# Patient Record
Sex: Female | Born: 1960 | Race: White | Hispanic: No | Marital: Single | Smoking: Never smoker
Health system: Southern US, Community
[De-identification: ages and names within clinical notes are randomized; demographics above are authoritative.]

## PROBLEM LIST (undated history)

## (undated) DIAGNOSIS — I10 Essential (primary) hypertension: Secondary | ICD-10-CM

## (undated) DIAGNOSIS — K219 Gastro-esophageal reflux disease without esophagitis: Secondary | ICD-10-CM

## (undated) DIAGNOSIS — J309 Allergic rhinitis, unspecified: Secondary | ICD-10-CM

## (undated) DIAGNOSIS — F32A Depression, unspecified: Secondary | ICD-10-CM

## (undated) DIAGNOSIS — F419 Anxiety disorder, unspecified: Secondary | ICD-10-CM

## (undated) DIAGNOSIS — Z9889 Other specified postprocedural states: Secondary | ICD-10-CM

## (undated) DIAGNOSIS — F329 Major depressive disorder, single episode, unspecified: Secondary | ICD-10-CM

## (undated) DIAGNOSIS — R112 Nausea with vomiting, unspecified: Secondary | ICD-10-CM

## (undated) DIAGNOSIS — G43909 Migraine, unspecified, not intractable, without status migrainosus: Secondary | ICD-10-CM

## (undated) DIAGNOSIS — M722 Plantar fascial fibromatosis: Secondary | ICD-10-CM

## (undated) HISTORY — PX: MYRINGOTOMY: SUR874

## (undated) HISTORY — DX: Allergic rhinitis, unspecified: J30.9

## (undated) HISTORY — DX: Essential (primary) hypertension: I10

## (undated) HISTORY — PX: UPPER GASTROINTESTINAL ENDOSCOPY: SHX188

## (undated) HISTORY — PX: NASAL SINUS SURGERY: SHX719

## (undated) HISTORY — PX: TONSILLECTOMY: SUR1361

## (undated) HISTORY — DX: Plantar fascial fibromatosis: M72.2

## (undated) HISTORY — DX: Anxiety disorder, unspecified: F41.9

## (undated) HISTORY — PX: COLONOSCOPY: SHX174

## (undated) HISTORY — PX: OTHER SURGICAL HISTORY: SHX169

## (undated) HISTORY — DX: Migraine, unspecified, not intractable, without status migrainosus: G43.909

## (undated) HISTORY — DX: Depression, unspecified: F32.A

## (undated) HISTORY — DX: Major depressive disorder, single episode, unspecified: F32.9

---

## 2000-09-29 ENCOUNTER — Ambulatory Visit (HOSPITAL_COMMUNITY): Admission: RE | Admit: 2000-09-29 | Discharge: 2000-09-29 | Payer: Self-pay | Admitting: Family Medicine

## 2000-09-29 ENCOUNTER — Encounter: Payer: Self-pay | Admitting: Family Medicine

## 2002-03-21 ENCOUNTER — Ambulatory Visit (HOSPITAL_COMMUNITY): Admission: RE | Admit: 2002-03-21 | Discharge: 2002-03-21 | Payer: Self-pay | Admitting: Otolaryngology

## 2002-03-21 ENCOUNTER — Encounter: Payer: Self-pay | Admitting: Otolaryngology

## 2002-04-16 ENCOUNTER — Encounter: Admission: RE | Admit: 2002-04-16 | Discharge: 2002-04-16 | Payer: Self-pay | Admitting: Otolaryngology

## 2002-04-16 ENCOUNTER — Encounter: Payer: Self-pay | Admitting: Otolaryngology

## 2002-05-21 ENCOUNTER — Encounter: Payer: Self-pay | Admitting: Family Medicine

## 2002-05-21 ENCOUNTER — Ambulatory Visit (HOSPITAL_COMMUNITY): Admission: RE | Admit: 2002-05-21 | Discharge: 2002-05-21 | Payer: Self-pay | Admitting: Family Medicine

## 2002-05-22 ENCOUNTER — Ambulatory Visit (HOSPITAL_COMMUNITY): Admission: RE | Admit: 2002-05-22 | Discharge: 2002-05-22 | Payer: Self-pay | Admitting: Family Medicine

## 2002-05-22 ENCOUNTER — Encounter: Payer: Self-pay | Admitting: Family Medicine

## 2004-05-03 IMAGING — CT CT CHEST W/ CM
1 of 2 series · 15 of 32 positions shown, 19 images · IV contrast (omnipaque)
Comparison: none

FINDINGS
CLINICAL DATA: 42-YEAR-OLD WITH NODULE SEEN ON CHEST X-RAY; PT HAS COUGH, SHORTNESS OF BREATH; NO
HX OF LUNG CA OR PRIOR SURGERIES
CT OF THE CHEST WITH CONTRAST
COMPARISON TO MULTIPLE PRIOR CHEST X-RAYS.  HELICAL IMAGES ARE PERFORMED THROUGH THE CHEST DURING
ADMINISTRATION OF 100 CC OF OMNIPAQUE 300.  NO MEDIASTINAL OR HILAR ADENOPATHY IS SEEN.  THERE ARE
SMALL, NOT ENLARGED, BILATERAL AXILLARY LYMPH NODES.  THERE IS PATCHY INFILTRATE INVOLVING THE
RIGHT MIDDLE LOBE EXTENDING TOWARDS THE HILUM WHERE AIR BRONCHOGRAMS ARE NOTED.  SIMILAR PATCHY
DENSITY IS ALSO SEEN WITHIN THE LATERAL PORTION OF THE LEFT LOWER LOBE AND JUST ABOVE THE RIGHT
HEMIDIAPHRAGM IN THE RIGHT LOWER LOBE.  INFECTIOUS PROCESS IS FAVORED.  HOWEVER, FOLLOW-UP CHEST X-
RAY IS RECOMMENDED TO DOCUMENT CLEARING OF THIS PROCESS.
IMAGES OF THE UPPER ABDOMEN ARE UNREMARKABLE.  NO PULMONARY NODULES ARE IDENTIFIED.
IMPRESSION
PATCHY INFILTRATES INVOLVING THE RIGHT MIDDLE LOBE AND BILATERAL LOWER LOBES AS DESCRIBED.  OPACITY
IS MORE CONFLUENT WITHIN THE RIGHT MIDDLE LOBE HOWEVER.  FINDINGS FAVOR INFECTIOUS PROCESS AND
THERE IS NO SIGNIFICANT ADENOPATHY.  FOLLOW-UP CHEST X-RAY IS RECOMMENDED TO DOCUMENT CLEARING
HOWEVER.

[Series 436: — · axial · 0.63mm/px · z∈[+1500,+1800]mm · 15 of 66 slices shown, 19 images]
[im 3/66  mediastinal]
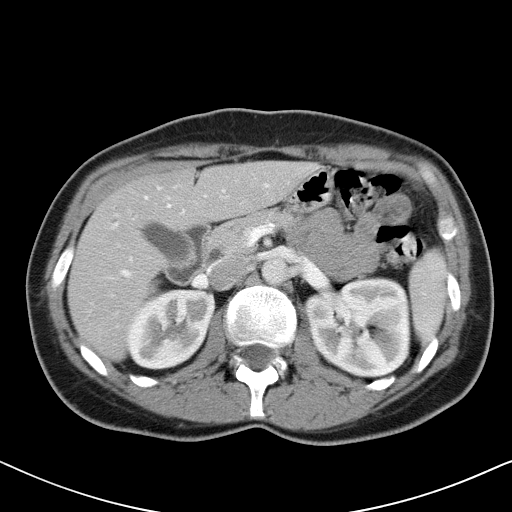
[im 3/66  lung]
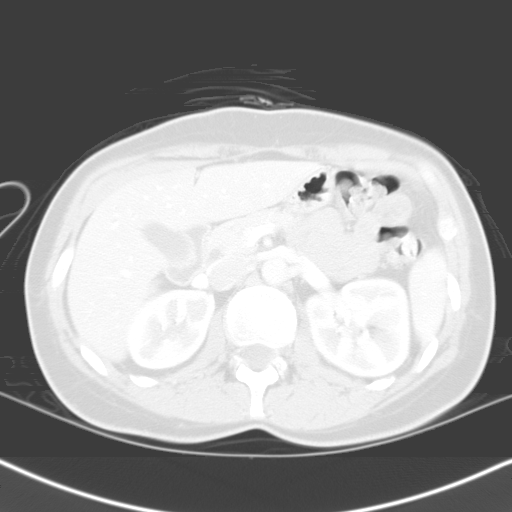
[im 8/66  lung]
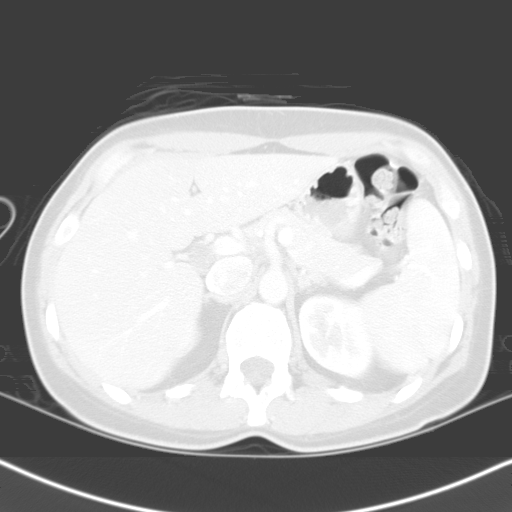
[im 13/66  lung]
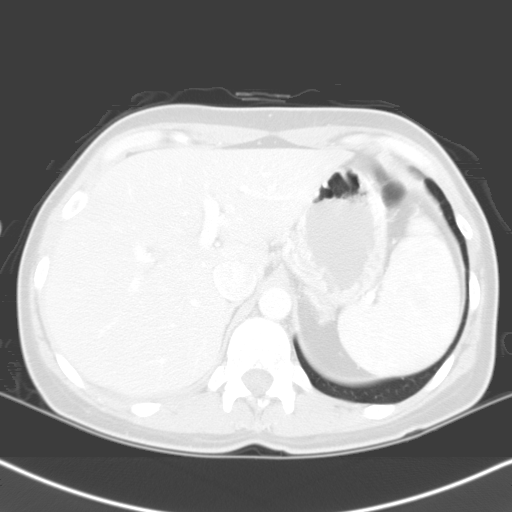
[im 17/66  lung]
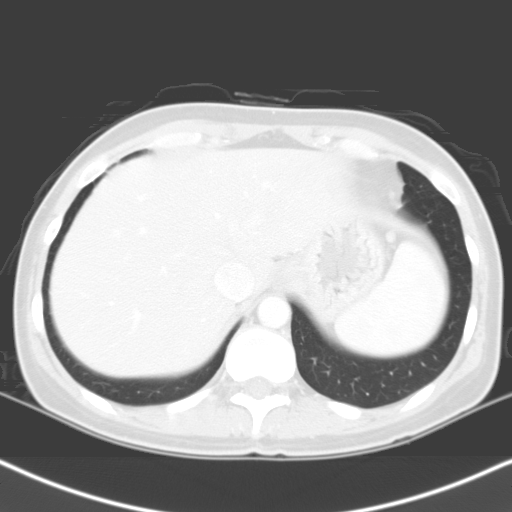
[im 22/66  mediastinal]
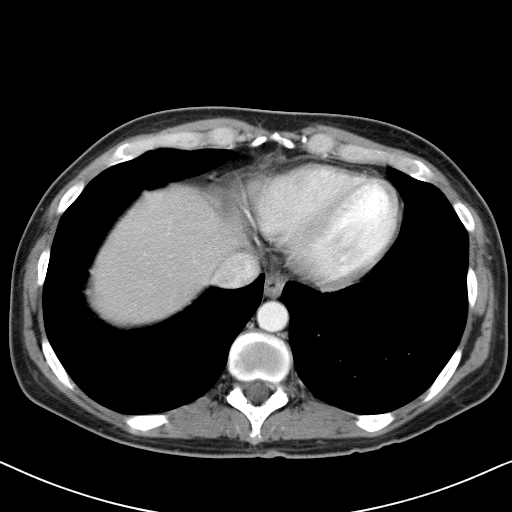
[im 22/66  lung]
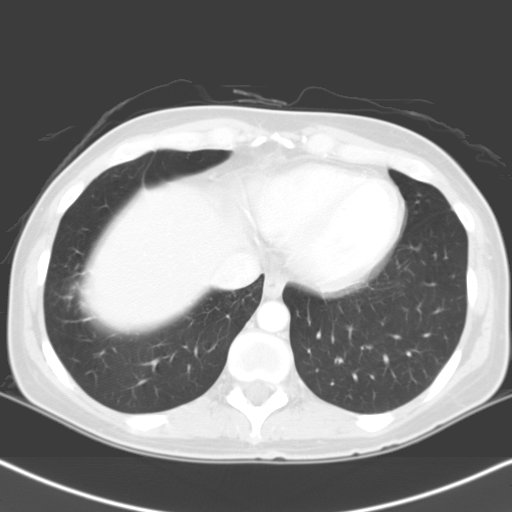
[im 25/66  lung]
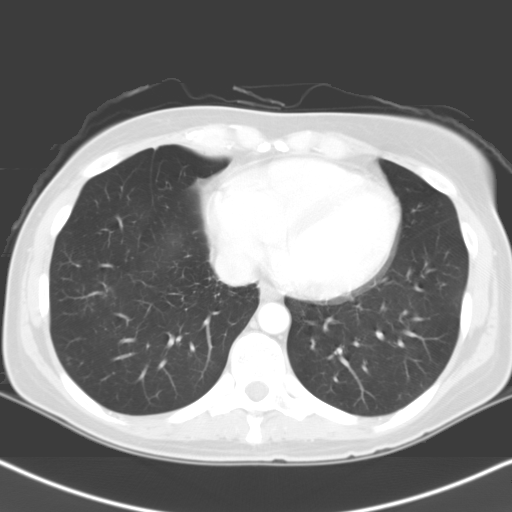
[im 29/66  lung]
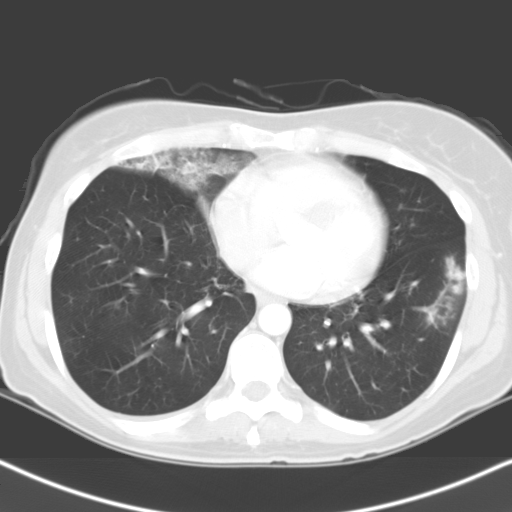
[im 33/66  lung]
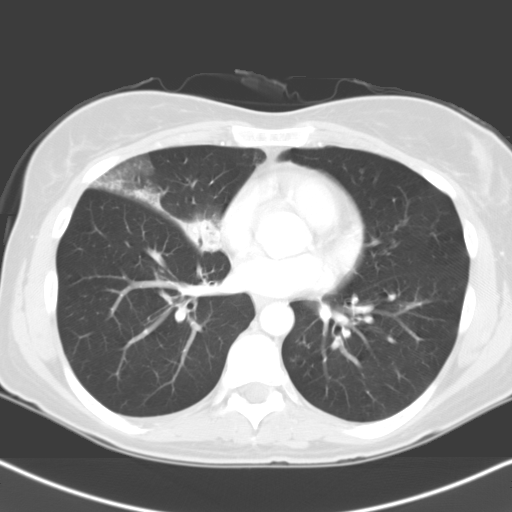
[im 37/66  mediastinal]
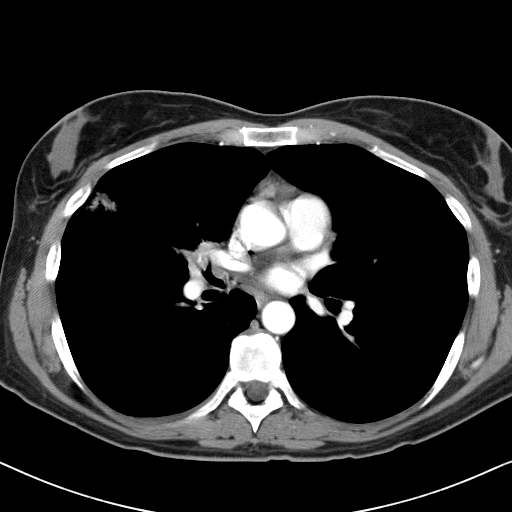
[im 37/66  lung]
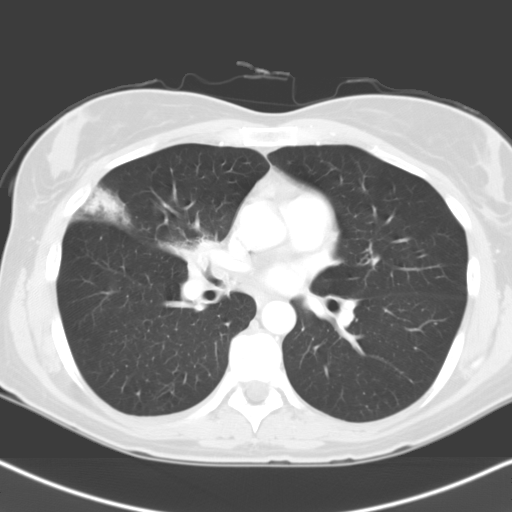
[im 41/66  lung]
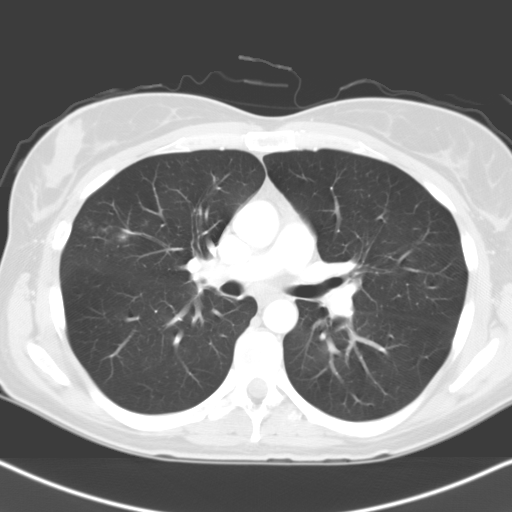
[im 44/66  lung]
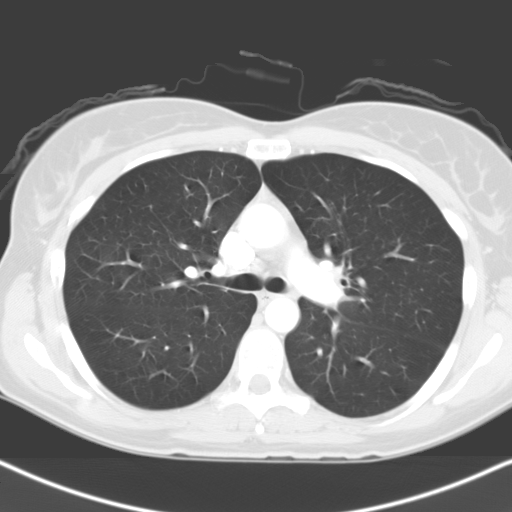
[im 49/66  lung]
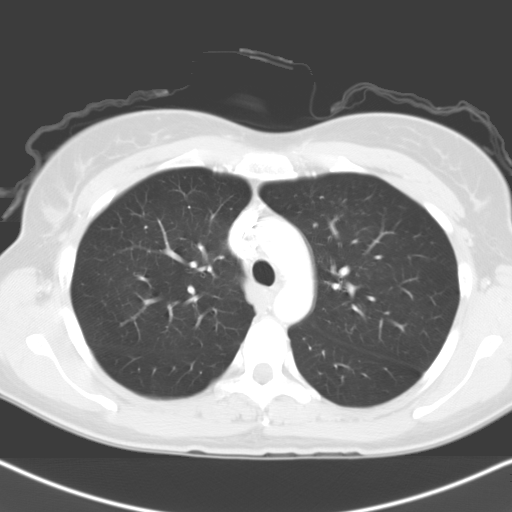
[im 53/66  mediastinal]
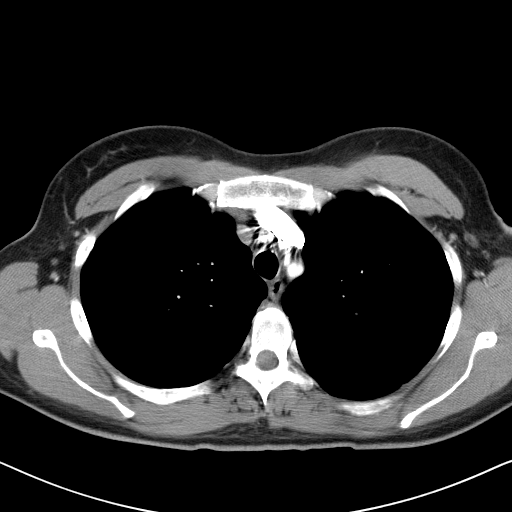
[im 53/66  lung]
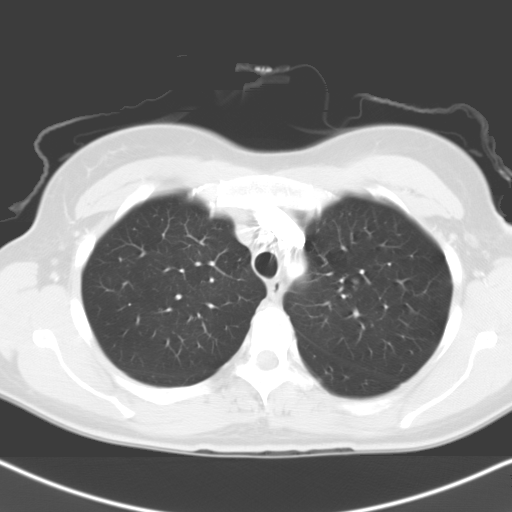
[im 58/66  lung]
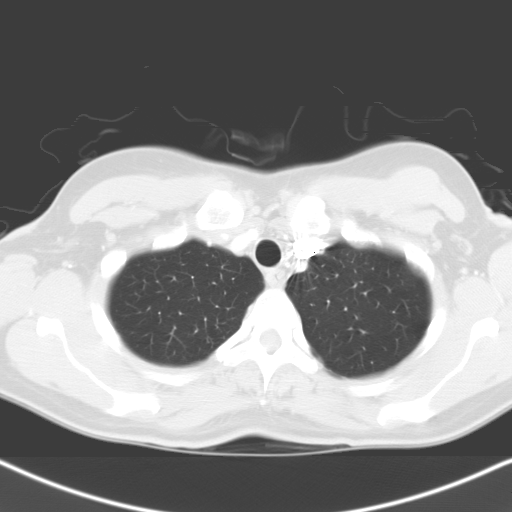
[im 63/66  lung]
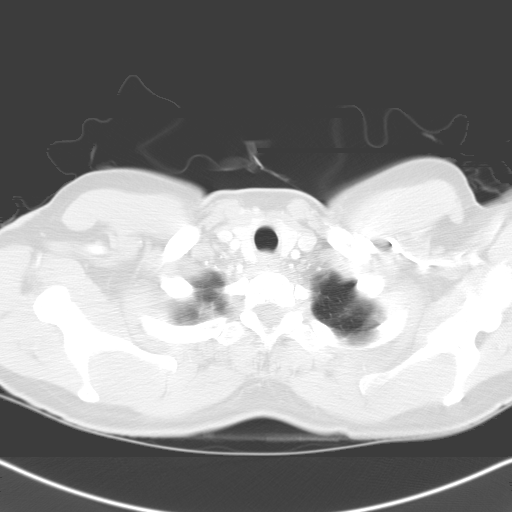

[15 of 32 positions shown; findings below may reference images not displayed]

## 2004-05-11 ENCOUNTER — Ambulatory Visit (HOSPITAL_COMMUNITY): Admission: RE | Admit: 2004-05-11 | Discharge: 2004-05-11 | Payer: Self-pay | Admitting: Family Medicine

## 2008-02-28 ENCOUNTER — Ambulatory Visit (HOSPITAL_COMMUNITY): Admission: RE | Admit: 2008-02-28 | Discharge: 2008-02-28 | Payer: Self-pay | Admitting: Family Medicine

## 2008-09-29 ENCOUNTER — Ambulatory Visit (HOSPITAL_COMMUNITY): Admission: RE | Admit: 2008-09-29 | Discharge: 2008-09-29 | Payer: Self-pay | Admitting: Family Medicine

## 2008-09-30 ENCOUNTER — Ambulatory Visit (HOSPITAL_COMMUNITY): Admission: RE | Admit: 2008-09-30 | Discharge: 2008-09-30 | Payer: Self-pay | Admitting: Family Medicine

## 2010-06-01 ENCOUNTER — Encounter: Payer: Self-pay | Admitting: Urgent Care

## 2010-06-01 ENCOUNTER — Ambulatory Visit (INDEPENDENT_AMBULATORY_CARE_PROVIDER_SITE_OTHER): Payer: 59 | Admitting: Urgent Care

## 2010-06-01 DIAGNOSIS — R198 Other specified symptoms and signs involving the digestive system and abdomen: Secondary | ICD-10-CM

## 2010-06-01 DIAGNOSIS — R194 Change in bowel habit: Secondary | ICD-10-CM

## 2010-06-01 DIAGNOSIS — K219 Gastro-esophageal reflux disease without esophagitis: Secondary | ICD-10-CM | POA: Insufficient documentation

## 2010-06-01 DIAGNOSIS — R1310 Dysphagia, unspecified: Secondary | ICD-10-CM | POA: Insufficient documentation

## 2010-06-01 HISTORY — PX: UPPER GASTROINTESTINAL ENDOSCOPY: SHX188

## 2010-06-01 MED ORDER — PEG 3350-KCL-NA BICARB-NACL 420 G PO SOLR: ORAL | Status: AC

## 2010-06-01 NOTE — Assessment & Plan Note (Addendum)
GERD/Dyspepsia, not controlled on PPI, some epigastric pain. ? Gastritis vs.m PUD.  Symptoms atypical for biliary colic.  Offerred PPI, pt declined until after EGD.

## 2010-06-01 NOTE — Assessment & Plan Note (Addendum)
[name] is a 50 y.o. female here for further evaluation of dysphagia for over a year.  She also has some dyspepsia /GERD symptoms that have failed OTC trial of antacids & prilosec.  She will need further evaluation w/ EGD and possible esophageal dilation to r/o complicated GERD, gastritis, esophageal web, ring, or stricture.  I have discussed risks & benefits which include, but are not limited to, bleeding, infection, perforation & drug reaction.  The patient agrees with this plan & written consent will be obtained.

## 2010-06-01 NOTE — Assessment & Plan Note (Addendum)
Mostly constipation & hard ball-like stools.  Never had a screening colonoscopy--will arrange at the time of EGD. Begin RESTORA daily.  I have discussed risks & benefits which include, but are not limited to, bleeding, infection, perforation & drug reaction.  The patient agrees with this plan & written consent will be obtained.

## 2010-06-01 NOTE — Progress Notes (Addendum)
Primary Care Physician:  Harlow Asa, MD, MD Sherie Don, NP) Primary Gastroenterologist:  Dr. Darrick Penna  Chief Complaint  Patient presents with  . Dysphagia  . Gastrophageal Reflux    HPI:  [name] is a 50 y.o. female here as a new patient for evaluation of dysphagia and change in bowel habits.  Several years of different meds for acid reflux.  "Took a pack" of meds previous, cannot remember the name, unsure if this was h pylori treatment, made sicker.  Took OTC antacids, started probiotics.  C/o epigastric pain & bloating after eating & burning upper abd pain.  Lots of belching.  Rare heartburn & water brash.  Tried prilosec no help.  Feels like food is sticking in upper esophagus.  Always w/ solid foods, no problems w/ liquids.  MVI lodges in throat, liquid would pass.  Appetite ok.  C/o some fatigue, trouble "getting up & going".  Very active lifestyle.    Never had colonoscopy before.  Notes stools have changed to much drier balls than before x 6-7 months.  BM q2 days.  Takes occ stool softener.  Takes OTC laxative if no BM in 2 days.  Tried miralax-no help.  Eats lots of fiber.  Drinks plenty water.  C/o incomplete evacuation.  Past Medical History  Diagnosis Date  . Constipation   . Chronic bronchitis   . Hypertension   . Asthma   . Plantar fasciitis   . Migraines     Past Surgical History  Procedure Date  . Right foot     plantar fascitis  . Nasal sinus surgery     Current Outpatient Prescriptions  Medication Sig Dispense Refill  . SUMAtriptan (IMITREX) 50 MG tablet Take 50 mg by mouth every 2 (two) hours as needed.        . verapamil (CALAN-SR) 240 MG CR tablet Take 240 mg by mouth at bedtime.          Allergies as of 06/01/2010  . (No Known Allergies)    Family History:  There is no known family history of colorectal carcinoma , liver disease, or inflammatory bowel disease.   Problem Relation Age of Onset  . Lung cancer Mother     smoker  . Heart  failure Father     History   Social History  . Marital Status: Single    Spouse Name: N/A    Number of Children: 0  . Years of Education: N/A   Occupational History  . outreach librarian     [employer]   Social History Main Topics  . Smoking status: Never Smoker   . Smokeless tobacco: Not on file  . Alcohol Use: No  . Drug Use: No  . Sexually Active: No   Review of Systems: Gen: Denies any fever, chills, sweats, anorexia, weight loss, and sleep disorder CV: Denies chest pain, angina, palpitations, syncope, orthopnea, PND, peripheral edema, and claudication. Resp: Denies dyspnea at rest, dyspnea with exercise, cough, sputum, wheezing, coughing up blood, and pleurisy. GI: Denies vomiting blood, jaundice, and fecal incontinence. GU : Denies urinary burning, blood in urine, urinary frequency, urinary hesitancy, nocturnal urination, and urinary incontinence. MS: Denies joint pain, limitation of movement, and swelling, stiffness, low back pain, extremity pain. Denies muscle weakness, cramps, atrophy.  Derm: Denies rash, itching, dry skin, hives, moles, warts, or unhealing ulcers.  Psych: Denies depression, anxiety, memory loss, suicidal ideation, hallucinations, paranoia, and confusion. Heme: Denies bruising, bleeding, and enlarged lymph nodes.  Physical  Exam: BP 128/73  Pulse 71  Temp(Src) 98.2 F (36.8 C) (Tympanic)  Ht 5\' 6"  (1.676 m)  Wt 151 lb 6.4 oz (68.675 kg)  BMI 24.44 kg/m2 General:   Alert,  Well-developed, well-nourished, pleasant and cooperative in NAD Head:  Normocephalic and atraumatic. Eyes:  Sclera clear, no icterus.   Conjunctiva pink. Ears:  Normal auditory acuity. Nose:  No deformity, discharge,  or lesions. Mouth:  No deformity or lesions, dentition normal. Neck:  Supple; no masses or thyromegaly. Lungs:  Clear throughout to auscultation.   No wheezes, crackles, or rhonchi. No acute distress. Heart:  Regular rate and rhythm; no murmurs,  clicks, rubs,  or gallops. Abdomen:  Soft, nontender and nondistended. No masses, hepatosplenomegaly or hernias noted. Normal bowel sounds, without guarding, and without rebound.   Rectal:  Deferred until time of colonoscopy.   Msk:  Symmetrical without gross deformities. Normal posture. Pulses:  Normal pulses noted. Extremities:  Without clubbing or edema. Neurologic:  Alert and  oriented x4;  grossly normal neurologically. Skin:  Intact without significant lesions or rashes. Cervical Nodes:  No significant cervical adenopathy. Psych:  Alert and cooperative. Normal mood and affect.

## 2010-06-01 NOTE — Patient Instructions (Signed)
Begin Restora daily   High-Fiber Diet A high-fiber diet changes your normal diet to include more whole grains, legumes, fruits, and vegetables. Changes in the diet involve replacing refined carbohydrates with unrefined foods. The calorie level of the diet is essentially unchanged. The Dietary Reference Intake (recommended amount) for adult males is 38 grams per day. For adult females, it is 25 grams per day. Pregnant and lactating women should consume 28 grams of fiber per day. Fiber is the intact part of a plant that is not broken down during digestion. Functional fiber is fiber that has been isolated from the plant to provide a beneficial effect in the body. PURPOSE Increase stool bulk.  Ease and regulate bowel movements.  Lower cholesterol.  INDICATIONS THAT YOU NEED MORE FIBER Constipation and hemorrhoids.  Uncomplicated diverticulosis (intestine condition) and irritable bowel syndrome.  Weight management.  As a protective measure against hardening of the arteries (atherosclerosis), diabetes, and cancer.  NOTE OF CAUTION If you have a digestive or bowel problem, ask your caregiver for advice before adding high-fiber foods to your diet. Some of the following medical problems are such that a high-fiber diet should not be used without consulting your caregiver. DO NOT USE WITH: Acute diverticulitis (intestine infection).  Partial small bowel obstructions.  Complicated diverticular disease involving bleeding, rupture (perforation), or abscess (boil, furuncle).  Presence of autonomic neuropathy (nerve damage) or gastric paresis (stomach cannot empty itself).  GUIDELINES FOR INCREASING FIBER IN THE DIET Start adding fiber to the diet slowly. A gradual increase of about 5 more grams (2 slices of whole-wheat bread, 2 servings of most fruits or vegetables, or 1 bowl of high-fiber cereal) per day is best. Too rapid an increase in fiber may result in constipation, flatulence, and bloating.  Drink  enough water and fluids to keep your urine clear or pale yellow. Water, juice, or caffeine-free drinks are recommended. Not drinking enough fluid may cause constipation.  Eat a variety of high-fiber foods rather than one type of fiber.  Try to increase your intake of fiber through using high-fiber foods rather than fiber pills or supplements that contain small amounts of fiber.  The goal is to change the types of food eaten. Do not supplement your present diet with high-fiber foods, but replace foods in your present diet.  INCLUDE A VARIETY OF FIBER SOURCES Replace refined and processed grains with whole grains, canned fruits with fresh fruits, and incorporate other fiber sources. White rice, white breads, and most bakery goods contain little or no fiber.  Brown whole-grain rice, buckwheat oats, and many fruits and vegetables are all good sources of fiber. These include: broccoli, Brussels sprouts, cabbage, cauliflower, beets, sweet potatoes, white potatoes (skin on), carrots, tomatoes, eggplant, squash, berries, fresh fruits, and dried fruits.  Cereals appear to be the richest source of fiber. Cereal fiber is found in whole grains and bran. Bran is the fiber-rich outer coat of cereal grain, which is largely removed in refining. In whole-grain cereals, the bran remains. In breakfast cereals, the largest amount of fiber is found in those with "bran" in their names. The fiber content is sometimes indicated on the label.  You may need to include additional fruits and vegetables each day.  In baking, for 1 cup white flour, you may use the following substitutions:  1 cup whole-wheat flour minus 2 tablespoons.  1/2 cup white flour plus 1/2 cup whole-wheat flour.  References: Dietary Reference Intakes: Recommended Intakes for Individuals. [employer]. Institute of Medicine.  Food and Nutrition Board. Document Released: 01/17/2005 Document Re-Released: 04/13/2009 [employer] Patient Information  2011 [employer], [place] Constipation in Adults Constipation is having fewer than 2 bowel movements per week. Usually, the stools are hard. As we grow older, constipation is more common. If you try to fix constipation with laxatives, the problem may get worse. This is because laxatives taken over a long period of time make the colon muscles weaker. A low-fiber diet, not taking in enough fluids, and taking some medicines may make these problems worse. MEDICATIONS THAT MAY CAUSE CONSTIPATION  Water pills (diuretics).  Calcium channel blockers (used to control blood pressure and for the heart).   Certain pain medicines (narcotics).   Anticholinergics.  Anti-inflammatory agents.   Antacids that contain aluminum.   DISEASES THAT CONTRIBUTE TO CONSTIPATION  Diabetes.  Parkinson's disease.   Dementia.   Stroke.  Depression.   Illnesses that cause problems with salt and water metabolism.   HOME CARE INSTRUCTIONS  Constipation is usually best cared for without medicines. Increasing dietary fiber and eating more fruits and vegetables is the best way to manage constipation.   Slowly increase fiber intake to 25 to 38 grams per day. Whole grains, fruits, vegetables, and legumes are good sources of fiber. A dietitian can further help you incorporate high-fiber foods into your diet.   Drink enough water and fluids to keep your urine clear or pale yellow.   A fiber supplement may be added to your diet if you cannot get enough fiber from foods.   Increasing your activities also helps improve regularity.   Suppositories, as suggested by your caregiver, will also help. If you are using antacids, such as aluminum or calcium containing products, it will be helpful to switch to products containing magnesium if your caregiver says it is okay.   If you have been given a liquid injection (enema) today, this is only a temporary measure. It should not be relied on for treatment of longstanding (chronic)  constipation.   Stronger measures, such as magnesium sulfate, should be avoided if possible. This may cause uncontrollable diarrhea. Using magnesium sulfate may not allow you time to make it to the bathroom.  SEEK IMMEDIATE MEDICAL CARE IF:  There is bright red blood in the stool.   The constipation stays for more than 4 days.   There is belly (abdominal) or rectal pain.   You do not seem to be getting better.   You have any questions or concerns.  MAKE SURE YOU:  Understand these instructions.   Will watch your condition.   Will get help right away if you are not doing well or get worse.  Document Released: 10/16/2003 Document Re-Released: 04/13/2009 [facility] Patient Information 2011 [place], [place].

## 2010-06-02 NOTE — Progress Notes (Signed)
Cc to PCP 

## 2010-06-08 ENCOUNTER — Encounter: Payer: 59 | Admitting: Gastroenterology

## 2010-06-08 ENCOUNTER — Ambulatory Visit (HOSPITAL_COMMUNITY)
Admission: RE | Admit: 2010-06-08 | Discharge: 2010-06-08 | Disposition: A | Discharge status: Home / Self Care | Payer: 59 | Source: Ambulatory Visit | Attending: Gastroenterology | Admitting: Gastroenterology

## 2010-06-08 ENCOUNTER — Other Ambulatory Visit: Payer: Self-pay | Admitting: Gastroenterology

## 2010-06-08 DIAGNOSIS — K222 Esophageal obstruction: Secondary | ICD-10-CM | POA: Insufficient documentation

## 2010-06-08 DIAGNOSIS — K2940 Chronic atrophic gastritis without bleeding: Secondary | ICD-10-CM

## 2010-06-08 DIAGNOSIS — Z1211 Encounter for screening for malignant neoplasm of colon: Secondary | ICD-10-CM

## 2010-06-08 DIAGNOSIS — K648 Other hemorrhoids: Secondary | ICD-10-CM | POA: Insufficient documentation

## 2010-06-08 DIAGNOSIS — K633 Ulcer of intestine: Secondary | ICD-10-CM | POA: Insufficient documentation

## 2010-06-08 DIAGNOSIS — R1310 Dysphagia, unspecified: Secondary | ICD-10-CM

## 2010-06-09 ENCOUNTER — Encounter: Payer: Self-pay | Admitting: Gastroenterology

## 2010-06-10 NOTE — Progress Notes (Signed)
Cc path to PCP 

## 2010-06-15 NOTE — Procedures (Signed)
NAMEJANIT, [name]            ACCOUNT NO.:  [id number]   MEDICAL RECORD NO.:  [id number]          PATIENT TYPE:  OUT   LOCATION:  RESP                          FACILITY:  [facility]   PHYSICIAN:  Edward L. Juanetta Gosling, M.D.DATE OF BIRTH:  [birth year]   DATE OF PROCEDURE:  DATE OF DISCHARGE:  09/30/2008                            PULMONARY FUNCTION TEST   1. Spirometry does not show a ventilatory defect, but does show      evidence of significant airflow obstruction.  2. There is no significant bronchodilator improvement.      Edward L. Juanetta Gosling, M.D.  Electronically Signed     ELH/MEDQ  D:  09/30/2008  T:  10/01/2008  Job:  [id number]   cc:   Donna Bernard, M.D.  Fax: [phone]

## 2010-06-17 ENCOUNTER — Ambulatory Visit (HOSPITAL_COMMUNITY)
Admission: RE | Admit: 2010-06-17 | Discharge: 2010-06-17 | Disposition: A | Discharge status: Home / Self Care | Payer: 59 | Source: Ambulatory Visit | Attending: Gastroenterology | Admitting: Gastroenterology

## 2010-06-17 ENCOUNTER — Encounter: Payer: 59 | Admitting: Gastroenterology

## 2010-06-17 DIAGNOSIS — K2990 Gastroduodenitis, unspecified, without bleeding: Secondary | ICD-10-CM

## 2010-06-17 DIAGNOSIS — I10 Essential (primary) hypertension: Secondary | ICD-10-CM | POA: Insufficient documentation

## 2010-06-17 DIAGNOSIS — Z79899 Other long term (current) drug therapy: Secondary | ICD-10-CM | POA: Insufficient documentation

## 2010-06-17 DIAGNOSIS — R1310 Dysphagia, unspecified: Secondary | ICD-10-CM

## 2010-06-17 DIAGNOSIS — K2970 Gastritis, unspecified, without bleeding: Secondary | ICD-10-CM | POA: Insufficient documentation

## 2010-06-17 DIAGNOSIS — K222 Esophageal obstruction: Secondary | ICD-10-CM | POA: Insufficient documentation

## 2010-06-30 NOTE — Op Note (Signed)
  NAMEMARISA, [name]            ACCOUNT NO.:  [id number]  MEDICAL RECORD NO.:  [id number]           PATIENT TYPE:  O  LOCATION:  DAYP                          FACILITY:  [facility]  PHYSICIAN:  Jonette Eva, M.D.     DATE OF BIRTH:  [birth year]  DATE OF PROCEDURE:  06/17/2010 DATE OF DISCHARGE:                              OPERATIVE REPORT   REFERRING PROVIDER:  Donna Bernard, MD  PROCEDURE:  Esophagogastroduodenoscopy with Savary dilation to 16 mm.  INDICATION FOR EXAM:  Ms. [name] is a 50 year old female who had a EGD with dilation in 1993.  She presented with solid dysphagia in 2012. She had a EGD with dilation to 15-mm on May 8.  She presents for subsequent dilation.  She had antral erosions and biopsies showed no evidence of H. pylori gastritis.  FINDINGS: 1. Distal esophageal stricture narrowing in the lumen approximately 13-     14 mm.  The esophagus was dilated from 12.8-16 mm.  The 16-mm     dilator passed with moderate resistance.  Otherwise no evidence of     Barrett's mass, erosion, or ulceration. 2. Multiple areas of linear erythema in the antrum.  Unable to     appreciate erosions. 3. Normal duodenal bulb in second portion of the duodenum.  DIAGNOSES: 1. Mild gastritis. 2. Distal esophageal stricture status post esophageal dilation. 3. Continue omeprazole indefinitely. 4. NEXT Endoscopy with propofol. 5. She has a follow up appointment to see me in 4 months regarding her     dysphagia.  If her symptoms persist, then would consider barium     swallow to evaluate for esophageal motility disorder. 6. No aspirin or NSAIDs for 21 days.  No anticoagulation for 5 days. 7. The patient is instructed to take a probiotic daily indefinitely.  MEDICATIONS: 1. Demerol 100 mg IV. 2. Versed 5 mg IV. 3. Phenergan 25 mg IV.  PROCEDURE TECHNIQUE:  Physical exam was performed.  Informed consent was obtained from the patient explaining the benefits, risks,  and alternatives to the procedure.  The patient was connected to monitor and placed in left lateral position.  Continuous oxygen was provided by nasal cannula and IV medicine administered through an indwelling cannula.  After administration of sedation, the patient's esophagus was intubated and scope was advanced under direct visualization to the second portion of the duodenum.  The scope was removed slowly by careful exam of the color, texture, anatomy, and integrity mucosa on the way out.  Prior to withdrawal of the scope, retroflex view of the cardia was performed and Savary guidewire was placed.  The esophagus was dilated from 12.8-16 mm.  The 12.8-mm dilator passed with mild resistance.  The 16-mm dilator passed with moderate resistance. The dilator and guidewire removed.  The patient was recovered in endoscopy and discharged home in satisfactory condition.     Jonette Eva, M.D.     SF/MEDQ  D:  06/17/2010  T:  06/17/2010  Job:  [id number]  cc:   Donna Bernard, M.D. Fax: [phone]  Electronically Signed by Jonette Eva M.D. on 06/30/2010 02:12:45 PM

## 2010-06-30 NOTE — Op Note (Signed)
[name], [name]            ACCOUNT NO.:  [id number]  MEDICAL RECORD NO.:  [id number]           PATIENT TYPE:  O  LOCATION:  DAYP                          FACILITY:  [facility]  PHYSICIAN:  Jonette Eva, M.D.     DATE OF BIRTH:  Jan 03, 1961  DATE OF PROCEDURE:  06/08/2010 DATE OF DISCHARGE:                               PROCEDURE NOTE   REFERRING PHYSICIAN:  Donna Bernard, M.D.  PROCEDURE: 1. Colonoscopy with cold forceps biopsy of the colon ulcers. 2. Esophagogastroduodenoscopy with cold forceps biopsy of the gastric     mucosa and Savary dilation to 15 mm.  INDICATION FOR EXAM:  [name] is a 50 year old female who had EGD with dilation for dysphagia in 1993.  She required 100 mg of Demerol and 6 Versed.  She presents with solid dysphagia, which has gotten worse over the years.  She had no problem with sedation in 1993.  Of note, she states she is difficult to sedate.  She presents for average risk colon cancer screening.  She does use varying amounts of anti-inflammatory drugs for headaches and various musculoskeletal pains.  FINDINGS: 1. Two 6-mm proximal transverse colon ulcers, biopsied via cold     forceps.  Otherwise, no polyps, masses, diverticula, AVMs seen. 2. Small internal hemorrhoids.  Otherwise, normal retroflexed view of     the rectum. 3. Distal esophageal stricture, narrowing the lumen to approximately     12 to 13 mm.  The esophagus was dilated from 12 to 15 mm.  The 15-     mm dilator passed with moderate resistance.  Otherwise, no evidence     of Barrett's mass, erosions, or ulcerations. 4. Multiple antral erosions associated with erythema.  The biopsies     obtained via cold forceps to evaluate for H. pylori gastritis. 5. Patchy erythema in the duodenal bulb.  Normal ampulla and second     portion of the duodenum.  DIAGNOSES: 1. Erosive gastritis. 2. Distal esophageal stricture. 3. Colon ulcers, most likely secondary to NSAID use. 4. Small  internal hemorrhoids. 5.  THE PATIENT HAS A TORTUOUS COLON.  RECOMMENDATIONS: 1. The patient is to use omeprazole one p.o. 30 minutes prior to her     first meal.  She should do this indefinitely.  Her distal     esophageal stricture is most likely secondary to uncontrolled     gastroesophageal reflux disease.  She does complain of epigastric     pain and bloating after eating and burning in her upper abdomen. 2. Screening colonoscopy in 10 years. 3. Will call with results of her biopsies. 4. She should follow high-fiber diet.  She is given handout on high-     fiber diet, hemorrhoids, gastritis, and reflux. 5. She will be rescheduled for an EGD with dilation in 7-14 days. 6. Follow up in 4 months regarding her dysphagia.  If her symptoms     persist after adequate dilation, then would consider barium swallow     to evaluate for esophageal motility disorder. 7. No aspirin or NSAIDs for 30 days.  No anticoagulation for 7 days.  MEDICATIONS: 1. Demerol 125  mg IV. 2. Versed 7 mg IV. 3. Phenergan 12.5 mg IV.  PROCEDURE TECHNIQUE:  Physical exam was performed.  Informed consent was obtained from the patient, explaining the benefits, risks, and alternatives to the procedure.  The patient was connected to the monitor and placed in left lateral position.  Continuous oxygen was provided by nasal cannula and IV medicine administered through an indwelling cannula.  After administration of sedation and rectal exam, the patient's rectum was intubated.  The scope was advanced under direct visualization to the cecum. The scope was removed slowly by carefully examining the color, texture, anatomy, and integrity of the mucosa on the way out.  After the colonoscopy, the patient's esophagus was intubated with a diagnostic gastroscope and the scope was advanced under direct visualization to the second portion of the duodenum.  Scope was removed slowly by careful examine of the color, texture,  anatomy and integrity of the mucosa on the way out.  Prior to withdrawal of the scope, retroflexed view of the cardia was performed and Savary guidewire was placed.  The esophagus was dilated from 12 to 15 mm.  The guidewire and the dilator were removed.  The patient was recovered in endoscopy and discharged home in satisfactory condition.  PATH: BENIGN COLON ULCER & CHRONIC GASTRITIS 2o to NSAIDs. Discussed at endo visit 5/18.   Jonette Eva, M.D.     SF/MEDQ  D:  06/08/2010  T:  06/09/2010  Job:  [id number]  cc:   Donna Bernard, M.D. Fax: [phone]  Electronically Signed by Jonette Eva M.D. on 06/30/2010 02:11:33 PM

## 2010-08-24 ENCOUNTER — Encounter: Payer: Self-pay | Admitting: Gastroenterology

## 2010-10-12 ENCOUNTER — Encounter: Payer: Self-pay | Admitting: Gastroenterology

## 2010-10-12 ENCOUNTER — Ambulatory Visit (INDEPENDENT_AMBULATORY_CARE_PROVIDER_SITE_OTHER): Payer: BC Managed Care – PPO | Admitting: Gastroenterology

## 2010-10-12 DIAGNOSIS — K219 Gastro-esophageal reflux disease without esophagitis: Secondary | ICD-10-CM

## 2010-10-12 DIAGNOSIS — R1310 Dysphagia, unspecified: Secondary | ICD-10-CM

## 2010-10-12 MED ORDER — OMEPRAZOLE 20 MG PO CPDR: 20.0000 mg | DELAYED_RELEASE_CAPSULE | Freq: Every day | ORAL | Status: DC

## 2010-10-12 NOTE — Progress Notes (Signed)
  Subjective:    Patient ID: [name], female    DOB: 1960-09-14, 51 y.o.   MRN: [id number]  PCP: STV LUKING  HPI Pt has no complaints. Wondering about bowel cleansing. Had episode of stomach burning and nauseated all day. Felt great after cleaning colon for TCS. Stomach feels better. Back to hard balls. Feels like she gets a good amount of fiber. When hiking eats protein bars and nuts. NO TROUBLE SWALLOWING EXCEPT WITH VITAMINS.   Past Medical History  Diagnosis Date  . Constipation   . Chronic bronchitis   . Hypertension   . Asthma   . Plantar fasciitis   . Migraines     Past Surgical History  Procedure Date  . Right foot     plantar fascitis  . Nasal sinus surgery   . Upper gastrointestinal endoscopy MAY 2012 DYSPHAGIA    [id number], ESO STR-SAV DIL 15 MM  . Upper gastrointestinal endoscopy MAY 2012    SAV DIL , next one w/ propofol  . Colonoscopy MAY 2012 ARS    NSAID COLON ULCERS, SML IH    No Known Allergies  Current Outpatient Prescriptions  Medication Sig Dispense Refill  . omeprazole (PRILOSEC) 20 MG capsule Take 1 capsule (20 mg total) by mouth daily.  31 capsule  11  . SUMAtriptan (IMITREX) 50 MG tablet Take 50 mg by mouth every 2 (two) hours as needed.        . verapamil (CALAN-SR) 240 MG CR tablet Take 240 mg by mouth at bedtime.            Review of Systems     Objective:   Physical Exam  Vitals reviewed. Constitutional: She is oriented to person, place, and time. She appears well-developed and well-nourished.  HENT:  Head: Atraumatic.  Cardiovascular: Normal rate, regular rhythm and normal heart sounds.   Pulmonary/Chest: Breath sounds normal.  Abdominal: Soft. Bowel sounds are normal. She exhibits no distension. There is no tenderness.  Neurological: She is alert and oriented to person, place, and time.       NO FOCAL DEFICITS          Assessment & Plan:

## 2010-10-14 ENCOUNTER — Ambulatory Visit: Payer: 59 | Admitting: Gastroenterology

## 2010-10-21 ENCOUNTER — Encounter: Payer: Self-pay | Admitting: Gastroenterology

## 2010-10-21 NOTE — Assessment & Plan Note (Signed)
RESOLVED. 

## 2010-10-21 NOTE — Assessment & Plan Note (Signed)
SX CONTROLLED.  CONTINUE OMP. OPV IN 6 MOS.

## 2010-12-14 ENCOUNTER — Other Ambulatory Visit: Payer: Self-pay | Admitting: Gastroenterology

## 2011-04-21 ENCOUNTER — Encounter: Payer: Self-pay | Admitting: Gastroenterology

## 2011-05-27 ENCOUNTER — Encounter: Payer: Self-pay | Admitting: Gastroenterology

## 2011-05-30 ENCOUNTER — Ambulatory Visit (INDEPENDENT_AMBULATORY_CARE_PROVIDER_SITE_OTHER): Payer: BC Managed Care – PPO | Admitting: Gastroenterology

## 2011-05-30 ENCOUNTER — Encounter: Payer: Self-pay | Admitting: Gastroenterology

## 2011-05-30 DIAGNOSIS — K219 Gastro-esophageal reflux disease without esophagitis: Secondary | ICD-10-CM

## 2011-05-30 DIAGNOSIS — R1310 Dysphagia, unspecified: Secondary | ICD-10-CM

## 2011-05-30 DIAGNOSIS — R194 Change in bowel habit: Secondary | ICD-10-CM

## 2011-05-30 DIAGNOSIS — R198 Other specified symptoms and signs involving the digestive system and abdomen: Secondary | ICD-10-CM

## 2011-05-30 NOTE — Assessment & Plan Note (Signed)
RESOLVED. 

## 2011-05-30 NOTE — Progress Notes (Signed)
  Subjective:    Patient ID: [name], female    DOB: 1960/10/04, 51 y.o.   MRN: [id number]  PCP: STVGerda Diss  HPI Stool hard balls/NOT HAVING SATISFACTORY BMs. Occasional bloating and nausea. Lifting weights and does cardio at home. Thinks she's eating better. Yogurt, fresh fruit. Smaller meals. MAY HAVE BURNING IN HER UPPER ABDOMEN-?1X/Q2WKS. QUIT FOR AWHILE. TAKING [facility] DAILY. EATS YoGURT:6X/WK. Some of the nausea comes if she doesn't have much on her stomach. Happens in work out. DRINKS SMOOTHIES  A LOT. WORKS~ 3 DAYS IN THE MORNING AND ALTERNATES WITH CARDIO ON THE BIKE AT HOME AND SOME ABD WORK.   Past Medical History  Diagnosis Date  . Constipation   . Chronic bronchitis   . Hypertension   . Asthma   . Plantar fasciitis   . Migraines     Past Surgical History  Procedure Date  . Right foot     plantar fascitis  . Nasal sinus surgery   . Upper gastrointestinal endoscopy MAY 2012 DYSPHAGIA    [id number], ESO STR-SAV DIL 15 MM  . Upper gastrointestinal endoscopy MAY 2012    SAV DIL , next one w/ propofol  . Colonoscopy MAY 2012 ARS    NSAID COLON ULCERS, SML IH      Review of Systems     Objective:   Physical Exam  Constitutional: She is oriented to person, place, and time. She appears well-developed and well-nourished. No distress.  HENT:  Head: Normocephalic and atraumatic.  Mouth/Throat: Oropharynx is clear and moist. No oropharyngeal exudate.  Eyes: Pupils are equal, round, and reactive to light. No scleral icterus.  Neck: Normal range of motion. Neck supple.  Cardiovascular: Normal rate, regular rhythm and normal heart sounds.   Pulmonary/Chest: Effort normal and breath sounds normal. No respiratory distress.  Abdominal: Soft. Bowel sounds are normal. She exhibits no distension. There is no tenderness.  Musculoskeletal: Normal range of motion. She exhibits no edema.  Lymphadenopathy:    She has no cervical adenopathy.   Neurological: She is alert and oriented to person, place, and time.       NO FOCAL DEFICITS   Psychiatric: She has a normal mood and affect.          Assessment & Plan:

## 2011-05-30 NOTE — Assessment & Plan Note (Signed)
STILL WITH HARD STOOLS/BLOATING/NAUSEA.  EAT 150-200 CALORIES PRIOR TO EXERCISE. REDUCE DAIRY. CONTINUE PROBIOTIC. CONTINUE WATER AND FIBER. CONSIDER AMITIZA. OPV IN 6 MOS.

## 2011-05-30 NOTE — Assessment & Plan Note (Signed)
SX CONTROLLED.  CONTINUE PRILOSEC. OPV IN 6 MOS.

## 2011-05-30 NOTE — Progress Notes (Signed)
Faxed to PCP

## 2011-05-30 NOTE — Patient Instructions (Signed)
Substitute soy milk or almond milk in your smoothies to reduce bloating.  EAT 150-200 CALORIES PRIOR TO WORK OUT TO PREVENT YOUR GLUCOSE FROM DROPPING & NAUSEA.  CONTINUE [name] HE[facility] AND PRILOSEC.  FOLLOW UP IN 6 MOS.

## 2011-06-24 NOTE — Progress Notes (Signed)
Reminder in epic to follow up in 6 months with SF °

## 2011-10-14 ENCOUNTER — Other Ambulatory Visit: Payer: Self-pay | Admitting: Gastroenterology

## 2011-12-12 ENCOUNTER — Encounter: Payer: Self-pay | Admitting: *Deleted

## 2012-02-14 ENCOUNTER — Encounter: Payer: Self-pay | Admitting: Gastroenterology

## 2012-02-15 ENCOUNTER — Ambulatory Visit (INDEPENDENT_AMBULATORY_CARE_PROVIDER_SITE_OTHER): Payer: BC Managed Care – PPO | Admitting: Gastroenterology

## 2012-02-15 ENCOUNTER — Encounter: Payer: Self-pay | Admitting: Gastroenterology

## 2012-02-15 VITALS — BP 150/81 | HR 64 | Temp 98.1°F | Ht 66.0 in | Wt 164.4 lb

## 2012-02-15 DIAGNOSIS — R198 Other specified symptoms and signs involving the digestive system and abdomen: Secondary | ICD-10-CM

## 2012-02-15 DIAGNOSIS — K219 Gastro-esophageal reflux disease without esophagitis: Secondary | ICD-10-CM

## 2012-02-15 DIAGNOSIS — R194 Change in bowel habit: Secondary | ICD-10-CM

## 2012-02-15 DIAGNOSIS — R1310 Dysphagia, unspecified: Secondary | ICD-10-CM

## 2012-02-15 MED ORDER — OMEPRAZOLE 20 MG PO CPDR: DELAYED_RELEASE_CAPSULE | ORAL | Status: DC

## 2012-02-15 NOTE — Assessment & Plan Note (Signed)
CONTINUES WITH INTERMITTENT CONSTIPATION.  ADD MIRALAX DAILY. INCREASE TO TWICE DAILY AFTER ONE WEEK IF STOOLS REMAIN HARD. CONTINUE FIBER/WATER. OPV IN 6 MOS.

## 2012-02-15 NOTE — Progress Notes (Signed)
  Subjective:    Patient ID: [name], female    DOB: [birth year], 52 y.o.   MRN: [id number]  PCP: Gerda Diss  HPI 3 mos ago working out. Started drinking large amounts of water and stool got better. STAYING HYDRATED. BACK TO "MALTED MILK BALLS." Only thing that has changed. Moved to Prilosec and when picked up BP med. NOW BP GOING UP. HAS APPT W/ PCP NEXT WEEK TO DISCUSS. TRYING TO ADD HEALTHY FOODS. SWALLOWING IS FINE. FEELS LIKE HEARTBURN HAS GOTTEN WORSE SINCE THEY CHANGED YOUR RX FROM OMEPRAZOLE TO PRILOSEC. NOT STRESSED. COFFEE: 3 CU/DAY. MAY OCCASIONALLY DRINK DECAF. EATS CHOCOLATE.   Past Medical History  Diagnosis Date  . Constipation   . Chronic bronchitis   . Hypertension   . Asthma   . Plantar fasciitis   . Migraines    Past Surgical History  Procedure Date  . Right foot     plantar fascitis  . Nasal sinus surgery   . Upper gastrointestinal endoscopy MAY 2012 DYSPHAGIA    [id number], ESO STR-SAV DIL 15 MM  . Upper gastrointestinal endoscopy MAY 2012    SAV DIL , next one w/ propofol  . Colonoscopy MAY 2012 ARS    SLF: NSAID COLON ULCERS, SML IH   No Known Allergies  Current Outpatient Prescriptions  Medication Sig Dispense Refill  . PRILOSEC 20 MG capsule TAKE ONE CAPSULE DAILY.    . SUMAtriptan (IMITREX) 50 MG tablet Take 50 mg by mouth every 2 (two) hours as needed.        . verapamil (CALAN-SR) 240 MG CR tablet Take 240 mg by mouth at bedtime.             Review of Systems     Objective:   Physical Exam  Vitals reviewed. Constitutional: She is oriented to person, place, and time. She appears well-nourished. No distress.  HENT:  Head: Normocephalic and atraumatic.  Mouth/Throat: Oropharynx is clear and moist. No oropharyngeal exudate.  Eyes: Pupils are equal, round, and reactive to light.  Neck: Normal range of motion. Neck supple.  Cardiovascular: Normal rate and regular rhythm.   Pulmonary/Chest: Effort normal and breath sounds  normal. No respiratory distress.  Abdominal: Soft. Bowel sounds are normal. She exhibits no distension. There is no tenderness.  Neurological: She is alert and oriented to person, place, and time.       NO FOCAL DEFICITS   Psychiatric: She has a normal mood and affect.          Assessment & Plan:

## 2012-02-15 NOTE — Patient Instructions (Addendum)
USE MIRALAX ONCE A DAY FOR A WEEK THEN IF YOU STILL HAVE HARD STOOLS INCREASE TO TWICE A DAY.  CONTINUE WATER, FIBER, AND EXERCISE.  CONTINUE PRILOSEC, BUT INCREASE IT TO TWICE A DAY. TAKE 30 MINUTES PRIOR TO MEALS.  FOLLOW UP IN 6 MOS.   Lifestyle and home remedies TO HELP CONTROL REFLUX SYMPTOMS  You may eliminate or reduce the frequency of heartburn by making the following lifestyle changes:    Control your weight. Being overweight is a major risk factor for heartburn and GERD. Excess pounds put pressure on your abdomen, pushing up your stomach and causing acid to back up into your esophagus.     Eat smaller meals. 4 TO 6 MEALS A DAY. This reduces pressure on the lower esophageal sphincter, helping to prevent the valve from opening and acid from washing back into your esophagus.     Loosen your belt. Clothes that fit tightly around your waist put pressure on your abdomen and the lower esophageal sphincter.    Eliminate heartburn triggers. Everyone has specific triggers.     Common triggers such as fatty or fried foods, spicy food, tomato sauce, carbonated beverages, alcohol, chocolate, mint, garlic, onion, caffeine and nicotine may make heartburn worse.     Avoid stooping or bending. Tying your shoes is OK. Bending over for longer periods to weed your garden isn't, especially soon after eating.     Don't lie down after a meal. Wait at least three to four hours after eating before going to bed, and don't lie down right after eating.   Alternative medicine   Several home remedies exist for treating GERD, but they provide only temporary relief. They include drinking baking soda (sodium bicarbonate) added to water or drinking other fluids such as baking soda mixed with cream of tartar and water.   Although these liquids create temporary relief by neutralizing, washing away or buffering acids, eventually they aggravate the situation by adding gas and fluid to your stomach, increasing  pressure and causing more acid reflux. Further, adding more sodium to your diet may increase your blood pressure and add stress to your heart, and excessive bicarbonate ingestion can alter the acid-base balance in your body.

## 2012-02-15 NOTE — Progress Notes (Signed)
Faxed to PCP

## 2012-02-15 NOTE — Assessment & Plan Note (Signed)
RESOLVED.  PPI BID. OPV IN 6 MOS

## 2012-02-15 NOTE — Assessment & Plan Note (Signed)
Not ideally controlled.  AVOID CHOCOLATE. REDUCE CAFFEINE INTAKE. INCREASE PRILOSEC TO BID. OPV IN 6 MOS

## 2012-02-22 NOTE — Progress Notes (Signed)
Reminder in epic to follow up in 6 months with SF in E30 °

## 2012-05-07 ENCOUNTER — Telehealth: Payer: Self-pay | Admitting: Nurse Practitioner

## 2012-05-07 ENCOUNTER — Telehealth: Payer: Self-pay | Admitting: Family Medicine

## 2012-05-07 NOTE — Telephone Encounter (Signed)
Patient says the Verapamil that you put her on is not working. Can you call her something else in to try to [facility]?

## 2012-05-07 NOTE — Telephone Encounter (Signed)
Is pt willing to go back on fluid pill? If so, we will add to verapmil If not, need appt with me to discuss options.

## 2012-05-07 NOTE — Telephone Encounter (Signed)
Patient says that the blood pressure medication that you have her on now(Verapamil 360mg ) is not working. Can you please send another Rx of something different to try to [facility]?

## 2012-05-08 NOTE — Telephone Encounter (Signed)
Med called into [facility]. Patient notified.

## 2012-05-08 NOTE — Telephone Encounter (Signed)
Dyazide 37.5/25 number thiry one qam. Three refills

## 2012-05-08 NOTE — Telephone Encounter (Signed)
Patient said she is willing to try fluid pill and if she doesn't like it she will call and schedule office visit.

## 2012-07-31 ENCOUNTER — Encounter: Payer: Self-pay | Admitting: Orthopedic Surgery

## 2012-07-31 ENCOUNTER — Other Ambulatory Visit: Payer: Self-pay | Admitting: Orthopedic Surgery

## 2012-07-31 ENCOUNTER — Ambulatory Visit (HOSPITAL_COMMUNITY)
Admission: RE | Admit: 2012-07-31 | Discharge: 2012-07-31 | Disposition: A | Discharge status: Home / Self Care | Payer: BC Managed Care – PPO | Source: Ambulatory Visit | Attending: Orthopedic Surgery | Admitting: Orthopedic Surgery

## 2012-07-31 ENCOUNTER — Ambulatory Visit (INDEPENDENT_AMBULATORY_CARE_PROVIDER_SITE_OTHER): Payer: BC Managed Care – PPO | Admitting: Orthopedic Surgery

## 2012-07-31 VITALS — BP 118/70 | Ht 67.0 in | Wt 154.0 lb

## 2012-07-31 DIAGNOSIS — M23329 Other meniscus derangements, posterior horn of medial meniscus, unspecified knee: Secondary | ICD-10-CM

## 2012-07-31 DIAGNOSIS — M23322 Other meniscus derangements, posterior horn of medial meniscus, left knee: Secondary | ICD-10-CM

## 2012-07-31 DIAGNOSIS — M25562 Pain in left knee: Secondary | ICD-10-CM

## 2012-07-31 DIAGNOSIS — M25569 Pain in unspecified knee: Secondary | ICD-10-CM | POA: Insufficient documentation

## 2012-07-31 NOTE — Progress Notes (Signed)
Patient ID: RAJAH LAMBA, female   DOB: 05-01-1960, 52 y.o.   MRN: [id number] Chief Complaint  Patient presents with  . Knee Pain    Left knee pain no injury    History: This is a 52 year old female 3 month history of gradual onset of medial knee pain which is sharp dull 8/10 comes and goes but it has been getting better lately she had a couple of catching episodes and she notices that when she externally rotates the hip flexes the knee especially at night to get a sharp pain on the medial aspect of the knee  Her review of systems includes a history of shortness of breath and cough heartburn and constipation joint pain and stiffness the other 11 systems were normal  She has hypertension some stomach issues no allergies she's had surgery on her sinuses or ears and her heel she is noted to be on verapamil 360 mg triamterene hydrochlorothiazide and omeprazole 20 is a family history of arthritis and cancer  She is single she works at [employer] she does not smoke  She had a left knee x-ray outside facility which I reviewed with the report and both are normal  Vital signs BP 118/70  Ht 5\' 7"  (1.702 m)  Wt 154 lb (69.854 kg)  BMI 24.11 kg/m2   General appearance: the patient is well-developed and well-nourished, grooming and hygiene are normal, body habitus ectomorphic  The patient is alert and oriented x 3; mood and affect are normal  Ambulatory status normal without a limp  Right knee/Left knee Inspection left knee medial joint line tenderness severe Range of motion decreased flexion compared to the opposite knee The Lachman test is normal the anterior and posterior drawer tests are normal and the collateral ligaments are stable Motor exam 5/5 Skin normal; no rash or laceration  McMurray's sign positive  The right knee Inspection revealed no tenderness, ROM was normal and motor exam grade 5/5 quad strength. Ligaments were stable   Cardiovascular exam normal pulse and perfusion  without edema tenderness or varicose veins  X-ray negative  Diagnosis torn medial meniscus  Plan at this point we then observe she's getting better I told her what to watch out for if she calls [place] back we will see her immediately and discuss surgical treatment versus MRI first.  Sensory exam is normal

## 2012-07-31 NOTE — Patient Instructions (Addendum)
Meniscus Injury of the Knee, Arthroscopy You may have an internal derangement of the knee. This means something is wrong inside the knee. Your caregiver can make a more accurate diagnosis (learning what is wrong) by performing an arthroscopic procedure. Your knee has two layers of cartilage. Articular cartilage covers the bone ends. It lets your knee bend and move smoothly. Two menisci (thick pads of cartilage that form a rim inside the joint) help absorb shock. They stabilize your knee. Ligaments bind the bones together. They support your knee joint. Muscles move the joint, help support your knee, and take stress off the joint itself.  ABOUT THE PROCEDURE Arthroscopy is a surgical technique. It allows your orthopedic surgeon to diagnose and treat your knee injury with accuracy. The surgeon looks into your knee through a small scope. The scope is like a small (pencil-sized) telescope. Arthroscopy is less invasive than open knee surgery. You can expect a more rapid recovery. Following your caregiver's instructions will help you recover rapidly and completely. Use crutches, rest, elevate, ice, and do knee exercises as instructed. The length of recovery depends on various factors. These factors include type of injury, age, physical condition, medical conditions, and your determination. How Ashanta Amoroso you will be away from your normal activities will depend on what kind of knee problem you have. It will also depend on how much tissue is damaged. Rebuilding your muscles after arthroscopy helps ensure a full recovery. RECOVERY Recovery after a meniscus injury depends on how much meniscus is damaged. It also depends on whether or not you have damaged other knee tissue. With small tears, your recovery may take a couple weeks. Larger tears will take longer. Meniscus injuries can usually be treated during arthroscopy. If your injury is on the inner edge of the meniscus, your surgeon may trim the meniscus back to a smooth rim.  In other cases, your surgeon will try to repair a damaged meniscus with sutures (stitches). This may lengthen your rehabilitation. It may provide better Harvey Matlack-term health by helping your knee retain its shock absorption abilities. Use crutches, limit weight bearing, rest, elevate, apply ice, and exercise your knee as instructed. If a brace is applied, use as directed. The length of recovery depends on various factors including type of injury, age, physical condition, other medical conditions, and your determination. Your caregiver will help with instructions for rehabilitation of your knee. HOME CARE INSTRUCTIONS  Use crutches and knee exercises as instructed.  Applying an ice pack to your operative site may help with discomfort. It may also keep the swelling down.  Only take over-the-counter or prescription medicines for pain, discomfort, inflammation (soreness)or fever as directed by your caregiver. You may use these only if your caregiver has not given medications that would interfere.  You may resume normal diet and activities as directed. SEEK MEDICAL ATTENTION IF:  There is increased bleeding (more than a small spot) from the wound.  You notice redness, swelling, or increasing pain in the wound.  Pus is coming from wound.  An unexplained oral temperature above 102 F (38.9 C) develops, or as your caregiver suggests.  You notice a foul smell coming from the wound or dressing. SEEK IMMEDIATE MEDICAL CARE IF:  You develop a rash.  You have difficulty breathing.  You have any allergic problems. Document Released: 01/15/2000 Document Revised: 04/11/2011 Document Reviewed: 04/02/2007 [facility] Patient Information 2014 [place], [place].

## 2012-08-06 ENCOUNTER — Other Ambulatory Visit: Payer: Self-pay | Admitting: Family Medicine

## 2012-08-06 ENCOUNTER — Other Ambulatory Visit: Payer: Self-pay | Admitting: Nurse Practitioner

## 2012-08-07 ENCOUNTER — Encounter: Payer: Self-pay | Admitting: *Deleted

## 2012-09-19 ENCOUNTER — Encounter: Payer: Self-pay | Admitting: Orthopedic Surgery

## 2012-09-19 ENCOUNTER — Other Ambulatory Visit: Payer: Self-pay | Admitting: *Deleted

## 2012-09-19 ENCOUNTER — Ambulatory Visit (INDEPENDENT_AMBULATORY_CARE_PROVIDER_SITE_OTHER): Payer: BC Managed Care – PPO | Admitting: Orthopedic Surgery

## 2012-09-19 VITALS — BP 142/88 | Ht 67.0 in | Wt 154.0 lb

## 2012-09-19 DIAGNOSIS — M23329 Other meniscus derangements, posterior horn of medial meniscus, unspecified knee: Secondary | ICD-10-CM

## 2012-09-19 DIAGNOSIS — M23322 Other meniscus derangements, posterior horn of medial meniscus, left knee: Secondary | ICD-10-CM

## 2012-09-19 NOTE — Patient Instructions (Addendum)
Surgery SALK medial meniscus prob sept 5th   Arthroscopic Procedure, Knee An arthroscopic procedure can find what is wrong with your knee. PROCEDURE Arthroscopy is a surgical technique that allows your orthopedic surgeon to diagnose and treat your knee injury with accuracy. They will look into your knee through a small instrument. This is almost like a small (pencil sized) telescope. Because arthroscopy affects your knee less than open knee surgery, you can anticipate a more rapid recovery. Taking an active role by following your caregiver's instructions will help with rapid and complete recovery. Use crutches, rest, elevation, ice, and knee exercises as instructed. The length of recovery depends on various factors including type of injury, age, physical condition, medical conditions, and your rehabilitation. Your knee is the joint between the large bones (femur and tibia) in your leg. Cartilage covers these bone ends which are smooth and slippery and allow your knee to bend and move smoothly. Two menisci, thick, semi-lunar shaped pads of cartilage which form a rim inside the joint, help absorb shock and stabilize your knee. Ligaments bind the bones together and support your knee joint. Muscles move the joint, help support your knee, and take stress off the joint itself. Because of this all programs and physical therapy to rehabilitate an injured or repaired knee require rebuilding and strengthening your muscles. AFTER THE PROCEDURE  After the procedure, you will be moved to a recovery area until most of the effects of the medication have worn off. Your caregiver will discuss the test results with you.   Only take over-the-counter or prescription medicines for pain, discomfort, or fever as directed by your caregiver.    You have been scheduled for arthroscocpic knee surgery.  All surgeries carry some risk.  Remember you always have the option of continued nonsurgical treatment. However in this  situation the risks vs. the benefits favor surgery as the best treatment option. The risks of the surgery includes the following but is not limited to bleeding, infection, pulmonary embolus, death from anesthesia, nerve injury vascular injury or need for further surgery, continued pain.  Specific to this procedure the following risks and complications are rare but possible Stiffness, pain, weakness, giving out  I expect  recovery will be in 3-4 weeks some patients take 6 weeks.  You  will need physical therapy after the procedure  Stop any blood thinning medication: such as warfarin, coumadin, naprosyn, ibuprofen, advil, diclofenac, aspirin

## 2012-09-19 NOTE — Progress Notes (Signed)
Patient ID: [name], female   DOB: Feb 01, 1961, 52 y.o.   MRN: [id number]  Chief Complaint  Patient presents with  . Follow-up    Recheck left knee and discuss surgery    BP 142/88  Ht 5\' 7"  (1.702 m)  Wt 154 lb (69.854 kg)  BMI 24.11 kg/m2  I want to go ahead with surgery , my knee is getting worse>  She is having more pain in her hip is starting hurt from limping she is limping more and having more difficulty after working images and more difficulty working out  Surgery discussed Informed consent discussed   Time 15 min

## 2012-09-20 ENCOUNTER — Ambulatory Visit: Payer: BC Managed Care – PPO | Admitting: Orthopedic Surgery

## 2012-09-24 ENCOUNTER — Telehealth: Payer: Self-pay | Admitting: Orthopedic Surgery

## 2012-09-24 NOTE — Telephone Encounter (Signed)
Contacted insurer, [employer], ph# [phone], regarding pre-authorization information for out-patient surgery scheduled 09/04/12, [facility], [place] [address], [phone], no pre-authorization is required, per Ree Edman; her name and today's date, 09/24/12, 4:59p.m for reference.

## 2012-09-26 ENCOUNTER — Encounter (HOSPITAL_COMMUNITY): Payer: Self-pay | Admitting: Pharmacy Technician

## 2012-09-28 ENCOUNTER — Encounter (HOSPITAL_COMMUNITY): Payer: Self-pay

## 2012-09-28 ENCOUNTER — Encounter (HOSPITAL_COMMUNITY)
Admission: RE | Admit: 2012-09-28 | Discharge: 2012-09-28 | Disposition: A | Discharge status: Home / Self Care | Payer: BC Managed Care – PPO | Source: Ambulatory Visit | Attending: Orthopedic Surgery | Admitting: Orthopedic Surgery

## 2012-09-28 ENCOUNTER — Other Ambulatory Visit: Payer: Self-pay

## 2012-09-28 DIAGNOSIS — Z01810 Encounter for preprocedural cardiovascular examination: Secondary | ICD-10-CM | POA: Insufficient documentation

## 2012-09-28 DIAGNOSIS — Z01812 Encounter for preprocedural laboratory examination: Secondary | ICD-10-CM | POA: Insufficient documentation

## 2012-09-28 HISTORY — DX: Gastro-esophageal reflux disease without esophagitis: K21.9

## 2012-09-28 HISTORY — DX: Nausea with vomiting, unspecified: R11.2

## 2012-09-28 HISTORY — DX: Other specified postprocedural states: Z98.890

## 2012-09-28 LAB — BASIC METABOLIC PANEL
CO2: 30 mEq/L (ref 19–32)
Calcium: 10 mg/dL (ref 8.4–10.5)
Creatinine, Ser: 1.01 mg/dL (ref 0.50–1.10)
Glucose, Bld: 118 mg/dL — ABNORMAL HIGH (ref 70–99)

## 2012-09-28 NOTE — Patient Instructions (Addendum)
[name]  09/28/2012   Your procedure is scheduled on:  10/05/2012 Report to [facility] at  820  AM.  Call this number if you have problems the morning of surgery: [phone]   Remember:   Do not eat food or drink liquids after midnight.   Take these medicines the morning of surgery with A SIP OF WATER: omeprazole, verapamil, maxzide   Do not wear jewelry, make-up or nail polish.  Do not wear lotions, powders, or perfumes.   Do not shave 48 hours prior to surgery. Men may shave face and neck.  Do not bring valuables to the hospital.  [facility] is not responsible for any belongings or valuables.  Contacts, dentures or bridgework may not be worn into surgery.  Leave suitcase in the car. After surgery it may be brought to your room.  For patients admitted to the hospital, checkout time is 11:00 AM the day of discharge.   Patients discharged the day of surgery will not be allowed to drive home.  Name and phone number of your driver: family  Special Instructions: Shower using CHG 2 nights before surgery and the night before surgery.  If you shower the day of surgery use CHG.  Use special wash - you have one bottle of CHG for all showers.  You should use approximately 1/3 of the bottle for each shower.   Please read over the following fact sheets that you were given: Pain Booklet, Coughing and Deep Breathing, Surgical Site Infection Prevention, Anesthesia Post-op Instructions and Care and Recovery After Surgery Arthroscopic Procedure, Knee An arthroscopic procedure can find what is wrong with your knee. PROCEDURE Arthroscopy is a surgical technique that allows your orthopedic surgeon to diagnose and treat your knee injury with accuracy. They will look into your knee through a small instrument. This is almost like a small (pencil sized) telescope. Because arthroscopy affects your knee less than open knee surgery, you can anticipate a more rapid recovery. Taking an active role by  following your caregiver's instructions will help with rapid and complete recovery. Use crutches, rest, elevation, ice, and knee exercises as instructed. The length of recovery depends on various factors including type of injury, age, physical condition, medical conditions, and your rehabilitation. Your knee is the joint between the large bones (femur and tibia) in your leg. Cartilage covers these bone ends which are smooth and slippery and allow your knee to bend and move smoothly. Two menisci, thick, semi-lunar shaped pads of cartilage which form a rim inside the joint, help absorb shock and stabilize your knee. Ligaments bind the bones together and support your knee joint. Muscles move the joint, help support your knee, and take stress off the joint itself. Because of this all programs and physical therapy to rehabilitate an injured or repaired knee require rebuilding and strengthening your muscles. AFTER THE PROCEDURE  After the procedure, you will be moved to a recovery area until most of the effects of the medication have worn off. Your caregiver will discuss the test results with you.  Only take over-the-counter or prescription medicines for pain, discomfort, or fever as directed by your caregiver. SEEK MEDICAL CARE IF:   You have increased bleeding from your wounds.  You see redness, swelling, or have increasing pain in your wounds.  You have pus coming from your wound.  You have an oral temperature above 102 F (38.9 C).  You notice a bad smell coming from the wound or dressing.  You have severe pain with any motion of your knee. SEEK IMMEDIATE MEDICAL CARE IF:   You develop a rash.  You have difficulty breathing.  You have any allergic problems. Document Released: 01/15/2000 Document Revised: 04/11/2011 Document Reviewed: 08/08/2007 [facility] Patient Information 2014 [place], [place]. PATIENT INSTRUCTIONS POST-ANESTHESIA  IMMEDIATELY FOLLOWING SURGERY:  Do not drive or  operate machinery for the first twenty four hours after surgery.  Do not make any important decisions for twenty four hours after surgery or while taking narcotic pain medications or sedatives.  If you develop intractable nausea and vomiting or a severe headache please notify your doctor immediately.  FOLLOW-UP:  Please make an appointment with your surgeon as instructed. You do not need to follow up with anesthesia unless specifically instructed to do so.  WOUND CARE INSTRUCTIONS (if applicable):  Keep a dry clean dressing on the anesthesia/puncture wound site if there is drainage.  Once the wound has quit draining you may leave it open to air.  Generally you should leave the bandage intact for twenty four hours unless there is drainage.  If the epidural site drains for more than 36-48 hours please call the anesthesia department.  QUESTIONS?:  Please feel free to call your physician or the hospital operator if you have any questions, and they will be happy to assist you.

## 2012-10-04 NOTE — H&P (Signed)
  Chief Complaint   Patient presents with   .  Knee Pain       Left knee pain no injury     History: This is a 52 year old female 3 month history of gradual onset of medial knee pain which is sharp dull 8/10 comes and goes but it has been getting better lately she had a couple of catching episodes and she notices that when she externally rotates the hip flexes the knee especially at night to get a sharp pain on the medial aspect of the knee  Her review of systems includes a history of shortness of breath and cough heartburn and constipation joint pain and stiffness the other 11 systems were normal  She has hypertension some stomach issues no allergies she's had surgery on her sinuses or ears and her heel she is noted to be on verapamil 360 mg triamterene hydrochlorothiazide and omeprazole 20 is a family history of arthritis and cancer  She is single she works at [employer] she does not smoke  She had a left knee x-ray outside facility which I reviewed with the report and both are normal  Vital signs BP 118/70  Ht 5\' 7"  (1.702 m)  Wt 154 lb (69.854 kg)  BMI 24.11 kg/m2   General appearance: the patient is well-developed and well-nourished, grooming and hygiene are normal, body habitus ectomorphic  The patient is alert and oriented x 3; mood and affect are normal  Ambulatory status normal without a limp  Right knee/Left knee Inspection left knee medial joint line tenderness severe Range of motion decreased flexion compared to the opposite knee The Lachman test is normal the anterior and posterior drawer tests are normal and the collateral ligaments are stable Motor exam 5/5 Skin normal; no rash or laceration   McMurray's sign positive  The right knee Inspection revealed no tenderness, ROM was normal and motor exam grade 5/5 quad strength. Ligaments were stable   Cardiovascular exam normal pulse and perfusion without edema tenderness or varicose veins  X-ray  negative  Diagnosis torn medial meniscus  I want to go ahead with surgery , my knee is getting worse>  She is having more pain in her hip is starting hurt from limping she is limping more and having more difficulty after working images and more difficulty working out  Arthroscopy left knee partial medial meniscectomy

## 2012-10-05 ENCOUNTER — Encounter (HOSPITAL_COMMUNITY)
Admission: RE | Disposition: A | Discharge status: Home / Self Care | Payer: Self-pay | Source: Ambulatory Visit | Attending: Orthopedic Surgery

## 2012-10-05 ENCOUNTER — Ambulatory Visit (HOSPITAL_COMMUNITY): Payer: BC Managed Care – PPO | Admitting: Anesthesiology

## 2012-10-05 ENCOUNTER — Encounter (HOSPITAL_COMMUNITY): Payer: Self-pay | Admitting: *Deleted

## 2012-10-05 ENCOUNTER — Encounter (HOSPITAL_COMMUNITY): Payer: Self-pay | Admitting: Anesthesiology

## 2012-10-05 ENCOUNTER — Ambulatory Visit (HOSPITAL_COMMUNITY)
Admission: RE | Admit: 2012-10-05 | Discharge: 2012-10-05 | Disposition: A | Discharge status: Home / Self Care | Payer: BC Managed Care – PPO | Source: Ambulatory Visit | Attending: Orthopedic Surgery | Admitting: Orthopedic Surgery

## 2012-10-05 DIAGNOSIS — M23322 Other meniscus derangements, posterior horn of medial meniscus, left knee: Secondary | ICD-10-CM

## 2012-10-05 DIAGNOSIS — M23329 Other meniscus derangements, posterior horn of medial meniscus, unspecified knee: Secondary | ICD-10-CM

## 2012-10-05 DIAGNOSIS — I10 Essential (primary) hypertension: Secondary | ICD-10-CM | POA: Insufficient documentation

## 2012-10-05 HISTORY — PX: KNEE ARTHROSCOPY WITH MEDIAL MENISECTOMY: SHX5651

## 2012-10-05 SURGERY — ARTHROSCOPY, KNEE, WITH MEDIAL MENISCECTOMY
Anesthesia: General | Site: Knee | Laterality: Left | Wound class: Clean

## 2012-10-05 MED ORDER — FENTANYL CITRATE 0.05 MG/ML IJ SOLN
INTRAMUSCULAR | Status: AC
  Filled 2012-10-05: qty 2

## 2012-10-05 MED ORDER — MIDAZOLAM HCL 2 MG/2ML IJ SOLN
INTRAMUSCULAR | Status: AC
  Filled 2012-10-05: qty 2

## 2012-10-05 MED ORDER — SODIUM CHLORIDE 0.9 % IR SOLN
Status: DC | PRN
  Administered 2012-10-05: 1000 mL

## 2012-10-05 MED ORDER — DEXAMETHASONE SODIUM PHOSPHATE 4 MG/ML IJ SOLN
4.0000 mg | Freq: Once | INTRAMUSCULAR | Status: AC
  Administered 2012-10-05: 4 mg via INTRAVENOUS

## 2012-10-05 MED ORDER — SODIUM CHLORIDE 0.9 % IR SOLN
Status: DC | PRN
  Administered 2012-10-05: 11:00:00

## 2012-10-05 MED ORDER — FENTANYL CITRATE 0.05 MG/ML IJ SOLN
INTRAMUSCULAR | Status: AC
  Filled 2012-10-05: qty 5

## 2012-10-05 MED ORDER — CEFAZOLIN SODIUM-DEXTROSE 2-3 GM-% IV SOLR
INTRAVENOUS | Status: AC
  Filled 2012-10-05: qty 50

## 2012-10-05 MED ORDER — CEFAZOLIN SODIUM-DEXTROSE 2-3 GM-% IV SOLR
2.0000 g | INTRAVENOUS | Status: AC
  Administered 2012-10-05: 2 g via INTRAVENOUS

## 2012-10-05 MED ORDER — BUPIVACAINE-EPINEPHRINE PF 0.5-1:200000 % IJ SOLN
INTRAMUSCULAR | Status: AC
  Filled 2012-10-05: qty 20

## 2012-10-05 MED ORDER — HYDROCODONE-ACETAMINOPHEN 7.5-325 MG PO TABS: 1.0000 | ORAL_TABLET | ORAL | Status: DC | PRN

## 2012-10-05 MED ORDER — GLYCOPYRROLATE 0.2 MG/ML IJ SOLN
0.2000 mg | Freq: Once | INTRAMUSCULAR | Status: AC
  Administered 2012-10-05: 0.2 mg via INTRAVENOUS

## 2012-10-05 MED ORDER — GLYCOPYRROLATE 0.2 MG/ML IJ SOLN
INTRAMUSCULAR | Status: AC
  Filled 2012-10-05: qty 1

## 2012-10-05 MED ORDER — DEXAMETHASONE SODIUM PHOSPHATE 4 MG/ML IJ SOLN
INTRAMUSCULAR | Status: AC
  Filled 2012-10-05: qty 1

## 2012-10-05 MED ORDER — SCOPOLAMINE 1 MG/3DAYS TD PT72
MEDICATED_PATCH | TRANSDERMAL | Status: AC
  Filled 2012-10-05: qty 1

## 2012-10-05 MED ORDER — ONDANSETRON HCL 4 MG/2ML IJ SOLN: 4.0000 mg | Freq: Once | INTRAMUSCULAR | Status: DC | PRN

## 2012-10-05 MED ORDER — ONDANSETRON HCL 4 MG/2ML IJ SOLN
INTRAMUSCULAR | Status: AC
  Filled 2012-10-05: qty 2

## 2012-10-05 MED ORDER — PROPOFOL 10 MG/ML IV EMUL
INTRAVENOUS | Status: AC
  Filled 2012-10-05: qty 20

## 2012-10-05 MED ORDER — LACTATED RINGERS IV SOLN
INTRAVENOUS | Status: DC
  Administered 2012-10-05: 1000 mL via INTRAVENOUS

## 2012-10-05 MED ORDER — SODIUM CHLORIDE 0.9 % IR SOLN
Status: DC | PRN
  Administered 2012-10-05 (×2)

## 2012-10-05 MED ORDER — FENTANYL CITRATE 0.05 MG/ML IJ SOLN
INTRAMUSCULAR | Status: DC | PRN
  Administered 2012-10-05: 25 ug via INTRAVENOUS
  Administered 2012-10-05: 50 ug via INTRAVENOUS

## 2012-10-05 MED ORDER — LIDOCAINE HCL (CARDIAC) 20 MG/ML IV SOLN
INTRAVENOUS | Status: DC | PRN
  Administered 2012-10-05: 30 mg via INTRAVENOUS

## 2012-10-05 MED ORDER — CHLORHEXIDINE GLUCONATE 4 % EX LIQD: 60.0000 mL | Freq: Once | CUTANEOUS | Status: DC

## 2012-10-05 MED ORDER — PROPOFOL 10 MG/ML IV BOLUS
INTRAVENOUS | Status: DC | PRN
  Administered 2012-10-05: 150 mg via INTRAVENOUS

## 2012-10-05 MED ORDER — ONDANSETRON HCL 4 MG/2ML IJ SOLN: 4.0000 mg | Freq: Once | INTRAMUSCULAR | Status: DC

## 2012-10-05 MED ORDER — BUPIVACAINE-EPINEPHRINE PF 0.5-1:200000 % IJ SOLN
INTRAMUSCULAR | Status: DC | PRN
  Administered 2012-10-05: 60 mL

## 2012-10-05 MED ORDER — FENTANYL CITRATE 0.05 MG/ML IJ SOLN
25.0000 ug | INTRAMUSCULAR | Status: DC | PRN
  Administered 2012-10-05: 50 ug via INTRAVENOUS

## 2012-10-05 MED ORDER — LIDOCAINE HCL (PF) 1 % IJ SOLN
INTRAMUSCULAR | Status: AC
  Filled 2012-10-05: qty 5

## 2012-10-05 MED ORDER — EPINEPHRINE HCL 1 MG/ML IJ SOLN
INTRAMUSCULAR | Status: AC
  Filled 2012-10-05: qty 5

## 2012-10-05 MED ORDER — SCOPOLAMINE 1 MG/3DAYS TD PT72
1.0000 | MEDICATED_PATCH | Freq: Once | TRANSDERMAL | Status: DC
  Administered 2012-10-05: 1.5 mg via TRANSDERMAL

## 2012-10-05 MED ORDER — MIDAZOLAM HCL 2 MG/2ML IJ SOLN
1.0000 mg | INTRAMUSCULAR | Status: DC | PRN
  Administered 2012-10-05 (×2): 2 mg via INTRAVENOUS

## 2012-10-05 SURGICAL SUPPLY — 55 items
ARTHROWAND PARAGON T2 (SURGICAL WAND)
BAG HAMPER (MISCELLANEOUS) ×2 IMPLANT
BANDAGE ELASTIC 6 VELCRO NS (GAUZE/BANDAGES/DRESSINGS) ×2 IMPLANT
BLADE AGGRESSIVE PLUS 4.0 (BLADE) ×2 IMPLANT
BLADE SURG SZ11 CARB STEEL (BLADE) ×2 IMPLANT
CHLORAPREP W/TINT 26ML (MISCELLANEOUS) ×3 IMPLANT
CLOTH BEACON ORANGE TIMEOUT ST (SAFETY) ×2 IMPLANT
COOLER CRYO IC GRAV AND TUBE (ORTHOPEDIC SUPPLIES) ×2 IMPLANT
CUFF CRYO KNEE LG 20X31 COOLER (ORTHOPEDIC SUPPLIES) IMPLANT
CUFF CRYO KNEE18X23 MED (MISCELLANEOUS) ×1 IMPLANT
CUFF TOURNIQUET SINGLE 34IN LL (TOURNIQUET CUFF) ×1 IMPLANT
CUFF TOURNIQUET SINGLE 44IN (TOURNIQUET CUFF) IMPLANT
CUTTER ANGLED DBL BITE 4.5 (BURR) IMPLANT
DECANTER SPIKE VIAL GLASS SM (MISCELLANEOUS) ×4 IMPLANT
GAUZE SPONGE 4X4 16PLY XRAY LF (GAUZE/BANDAGES/DRESSINGS) ×2 IMPLANT
GAUZE XEROFORM 5X9 LF (GAUZE/BANDAGES/DRESSINGS) ×2 IMPLANT
GLOVE BIOGEL PI IND STRL 7.0 (GLOVE) IMPLANT
GLOVE BIOGEL PI IND STRL 7.5 (GLOVE) IMPLANT
GLOVE BIOGEL PI INDICATOR 7.0 (GLOVE) ×1
GLOVE BIOGEL PI INDICATOR 7.5 (GLOVE) ×1
GLOVE ECLIPSE 6.5 STRL STRAW (GLOVE) ×1 IMPLANT
GLOVE OPTIFIT SS 6.5 STRL BRWN (GLOVE) ×1 IMPLANT
GLOVE SKINSENSE NS SZ8.0 LF (GLOVE) ×1
GLOVE SKINSENSE STRL SZ8.0 LF (GLOVE) ×1 IMPLANT
GLOVE SS N UNI LF 8.5 STRL (GLOVE) ×2 IMPLANT
GOWN STRL REIN XL XLG (GOWN DISPOSABLE) ×5 IMPLANT
HLDR LEG FOAM (MISCELLANEOUS) ×1 IMPLANT
IV NS IRRIG 3000ML ARTHROMATIC (IV SOLUTION) ×5 IMPLANT
KIT BLADEGUARD II DBL (SET/KITS/TRAYS/PACK) ×2 IMPLANT
KIT ROOM TURNOVER AP CYSTO (KITS) ×2 IMPLANT
LEG HOLDER FOAM (MISCELLANEOUS) ×1
MANIFOLD NEPTUNE II (INSTRUMENTS) ×2 IMPLANT
MARKER SKIN DUAL TIP RULER LAB (MISCELLANEOUS) ×2 IMPLANT
NDL HYPO 18GX1.5 BLUNT FILL (NEEDLE) ×1 IMPLANT
NDL HYPO 21X1.5 SAFETY (NEEDLE) ×1 IMPLANT
NDL SPNL 18GX3.5 QUINCKE PK (NEEDLE) ×1 IMPLANT
NEEDLE HYPO 18GX1.5 BLUNT FILL (NEEDLE) ×2 IMPLANT
NEEDLE HYPO 21X1.5 SAFETY (NEEDLE) ×2 IMPLANT
NEEDLE SPNL 18GX3.5 QUINCKE PK (NEEDLE) ×2 IMPLANT
NS IRRIG 1000ML POUR BTL (IV SOLUTION) ×2 IMPLANT
PACK ARTHRO LIMB DRAPE STRL (MISCELLANEOUS) ×2 IMPLANT
PAD ABD 5X9 TENDERSORB (GAUZE/BANDAGES/DRESSINGS) ×2 IMPLANT
PAD ARMBOARD 7.5X6 YLW CONV (MISCELLANEOUS) ×2 IMPLANT
PADDING CAST COTTON 6X4 STRL (CAST SUPPLIES) ×2 IMPLANT
SET ARTHROSCOPY INST (INSTRUMENTS) ×2 IMPLANT
SET ARTHROSCOPY PUMP TUBE (IRRIGATION / IRRIGATOR) ×2 IMPLANT
SET BASIN LINEN APH (SET/KITS/TRAYS/PACK) ×2 IMPLANT
SPONGE GAUZE 4X4 12PLY (GAUZE/BANDAGES/DRESSINGS) ×2 IMPLANT
SUT ETHILON 3 0 FSL (SUTURE) ×1 IMPLANT
SYR 30ML LL (SYRINGE) ×2 IMPLANT
SYRINGE 10CC LL (SYRINGE) ×2 IMPLANT
WAND 50 DEG COVAC W/CORD (SURGICAL WAND) ×1 IMPLANT
WAND 90 DEG TURBOVAC W/CORD (SURGICAL WAND) IMPLANT
WAND ARTHRO PARAGON T2 (SURGICAL WAND) IMPLANT
YANKAUER SUCT BULB TIP 10FT TU (MISCELLANEOUS) ×7 IMPLANT

## 2012-10-05 NOTE — Op Note (Signed)
10/05/2012  10:39 AM  PATIENT:  [name]  52 y.o. female  PRE-OPERATIVE DIAGNOSIS:  Meniscal Tear left knee  POST-OPERATIVE DIAGNOSIS:  Meniscal Tear left knee  Operative findings displaced tear posterior horn medial meniscus Remaining joint surfaces anterior cruciate ligament patellofemoral joint normal  PROCEDURE:  Procedure(s): KNEE ARTHROSCOPY WITH MEDIAL MENISECTOMY (Left)  SURGEON:  Surgeon(s) and Role:    * Vickki Hearing, MD - Primary  PHYSICIAN ASSISTANT:   ASSISTANTS: none   ANESTHESIA:   general  EBL:  Total I/O In: 500 [I.V.:500] Out: -   BLOOD ADMINISTERED:none  DRAINS: none   LOCAL MEDICATIONS USED: 60 cc of Marcaine with epinephrine  SPECIMEN:  No Specimen  DISPOSITION OF SPECIMEN:  N/A  COUNTS:  YES  TOURNIQUET:    DICTATION: .Dragon Dictation  PLAN OF CARE: Discharge to home after PACU  PATIENT DISPOSITION:  PACU - hemodynamically stable.   Delay start of Pharmacological VTE agent (>24hrs) due to surgical blood loss or risk of bleeding: not applicable  Details of procedure  Indications for procedure pain mechanical symptoms unresponsive to nonoperative treatment  The patient was identified in the preop holding area and  the left knee was confirmed as a surgical site and marked. The chart was reviewed  The patient was taken to the operating room and  was given appropriate preoperative antibiotic  and general anesthesia was administered. The operative leg (left) was placed in the arthroscopic leg holder, the well leg was placed in a well leg holder  The left leg was then prepped and draped sterile The surgical site was confirmed and the timeout procedure was completed  The lateral portal was injected with Marcaine with epinephrine solution and a stab wound was made. The scope was placed in the lateral portal into the medial compartment. The  Diagnostic portion of the  arthroscopy was completed. A medial portal was established in  the same fashion and a probe was placed into the joint. The diagnostic arthroscopy   was repeated using a probe to palpate intra-articular structures  A duckbill forceps was used to morcellized the meniscal tear, the fragments were removed with a motorized shaver. The meniscus was then balanced with an ArthroCare wand 50 probe.  A probe was then used to confirm a stable rim.  The knee was irrigated meniscal fragments remaining were removed. The portals were closed with 3-0 nylon suture. The knee joint was then injected with 45 cc of Marcaine with epinephrine. A sterile dressing was applied followed by an Ace bandage and a Cryo/Cuff.  The patient was extubated and taken to the recovery room in stable condition.

## 2012-10-05 NOTE — Anesthesia Procedure Notes (Addendum)
Procedure Name: LMA Insertion Date/Time: 10/05/2012 10:02 AM Performed by: Glynn Octave E Pre-anesthesia Checklist: Patient identified, Patient being monitored, Emergency Drugs available, Timeout performed and Suction available Patient Re-evaluated:Patient Re-evaluated prior to inductionOxygen Delivery Method: Circle System Utilized Preoxygenation: Pre-oxygenation with 100% oxygen Intubation Type: IV induction Ventilation: Mask ventilation without difficulty LMA: LMA inserted LMA Size: 4.0 Number of attempts: 1 Placement Confirmation: positive ETCO2 and breath sounds checked- equal and bilateral

## 2012-10-05 NOTE — Anesthesia Preprocedure Evaluation (Signed)
Anesthesia Evaluation  Patient identified by MRN, date of birth, ID band Patient awake    Reviewed: Allergy & Precautions, H&P , NPO status , Patient's Chart, lab work & pertinent test results  History of Anesthesia Complications (+) PONV  Airway Mallampati: II TM Distance: >3 FB     Dental  (+) Teeth Intact   Pulmonary asthma , Current Smoker,  breath sounds clear to auscultation        Cardiovascular hypertension, Pt. on medications Rhythm:Regular Rate:Normal     Neuro/Psych  Headaches, PSYCHIATRIC DISORDERS Anxiety Depression    GI/Hepatic GERD- ( inactive now)  Controlled and Medicated,  Endo/Other    Renal/GU      Musculoskeletal   Abdominal   Peds  Hematology   Anesthesia Other Findings   Reproductive/Obstetrics                           Anesthesia Physical Anesthesia Plan  ASA: II  Anesthesia Plan: General   Post-op Pain Management:    Induction: Intravenous  Airway Management Planned: LMA  Additional Equipment:   Intra-op Plan:   Post-operative Plan: Extubation in OR  Informed Consent: I have reviewed the patients History and Physical, chart, labs and discussed the procedure including the risks, benefits and alternatives for the proposed anesthesia with the patient or authorized representative who has indicated his/her understanding and acceptance.     Plan Discussed with:   Anesthesia Plan Comments:         Anesthesia Quick Evaluation

## 2012-10-05 NOTE — Transfer of Care (Signed)
Immediate Anesthesia Transfer of Care Note  Patient: [name]  Procedure(s) Performed: Procedure(s): KNEE ARTHROSCOPY WITH MEDIAL MENISECTOMY (Left)  Patient Location: PACU  Anesthesia Type:General  Level of Consciousness: awake  Airway & Oxygen Therapy: Patient Spontanous Breathing  Post-op Assessment: Report given to PACU RN  Post vital signs: Reviewed and stable  Complications: No apparent anesthesia complications

## 2012-10-05 NOTE — Interval H&P Note (Signed)
History and Physical Interval Note:  10/05/2012 9:24 AM  [name]  has presented today for surgery, with the diagnosis of Meniscal Tear left knee  The various methods of treatment have been discussed with the patient and family. After consideration of risks, benefits and other options for treatment, the patient has consented to  Procedure(s): KNEE ARTHROSCOPY WITH MEDIAL MENISECTOMY (Left) as a surgical intervention .  The patient's history has been reviewed, patient examined, no change in status, stable for surgery.  I have reviewed the patient's chart and labs.  Questions were answered to the patient's satisfaction.     Fuller Canada

## 2012-10-05 NOTE — Brief Op Note (Signed)
10/05/2012  10:39 AM  PATIENT:  [name]  52 y.o. female  PRE-OPERATIVE DIAGNOSIS:  Meniscal Tear left knee  POST-OPERATIVE DIAGNOSIS:  Meniscal Tear left knee  Operative findings displaced tear posterior horn medial meniscus Remaining joint surfaces anterior cruciate ligament patellofemoral joint normal  PROCEDURE:  Procedure(s): KNEE ARTHROSCOPY WITH MEDIAL MENISECTOMY (Left)  SURGEON:  Surgeon(s) and Role:    * Dace Denn E Devonte Migues, MD - Primary  PHYSICIAN ASSISTANT:   ASSISTANTS: none   ANESTHESIA:   general  EBL:  Total I/O In: 500 [I.V.:500] Out: -   BLOOD ADMINISTERED:none  DRAINS: none   LOCAL MEDICATIONS USED: 60 cc of Marcaine with epinephrine  SPECIMEN:  No Specimen  DISPOSITION OF SPECIMEN:  N/A  COUNTS:  YES  TOURNIQUET:    DICTATION: .Dragon Dictation  PLAN OF CARE: Discharge to home after PACU  PATIENT DISPOSITION:  PACU - hemodynamically stable.   Delay start of Pharmacological VTE agent (>24hrs) due to surgical blood loss or risk of bleeding: not applicable  Details of procedure  Indications for procedure pain mechanical symptoms unresponsive to nonoperative treatment  The patient was identified in the preop holding area and  the left knee was confirmed as a surgical site and marked. The chart was reviewed  The patient was taken to the operating room and  was given appropriate preoperative antibiotic  and general anesthesia was administered. The operative leg (left) was placed in the arthroscopic leg holder, the well leg was placed in a well leg holder  The left leg was then prepped and draped sterile The surgical site was confirmed and the timeout procedure was completed  The lateral portal was injected with Marcaine with epinephrine solution and a stab wound was made. The scope was placed in the lateral portal into the medial compartment. The  Diagnostic portion of the  arthroscopy was completed. A medial portal was established in  the same fashion and a probe was placed into the joint. The diagnostic arthroscopy   was repeated using a probe to palpate intra-articular structures  A duckbill forceps was used to morcellized the meniscal tear, the fragments were removed with a motorized shaver. The meniscus was then balanced with an ArthroCare wand 50 probe.  A probe was then used to confirm a stable rim.  The knee was irrigated meniscal fragments remaining were removed. The portals were closed with 3-0 nylon suture. The knee joint was then injected with 45 cc of Marcaine with epinephrine. A sterile dressing was applied followed by an Ace bandage and a Cryo/Cuff.  The patient was extubated and taken to the recovery room in stable condition. 

## 2012-10-05 NOTE — Anesthesia Postprocedure Evaluation (Addendum)
  Anesthesia Post-op Note  Patient: [name]  Procedure(s) Performed: Procedure(s): KNEE ARTHROSCOPY WITH MEDIAL MENISECTOMY (Left)  Patient Location: PACU  Anesthesia Type:General  Level of Consciousness: awake, alert  and oriented  Airway and Oxygen Therapy: Patient Spontanous Breathing and Patient connected to face mask oxygen  Post-op Pain: none  Post-op Assessment: Post-op Vital signs reviewed, Patient's Cardiovascular Status Stable, Respiratory Function Stable, Patent Airway and No signs of Nausea or vomiting  Post-op Vital Signs: Reviewed and stable  Complications: No apparent anesthesia complications  142/59, 98, 15, sats98

## 2012-10-08 ENCOUNTER — Ambulatory Visit: Payer: BC Managed Care – PPO | Admitting: Orthopedic Surgery

## 2012-10-08 ENCOUNTER — Encounter (HOSPITAL_COMMUNITY): Payer: Self-pay | Admitting: Orthopedic Surgery

## 2012-10-09 ENCOUNTER — Encounter: Payer: Self-pay | Admitting: Orthopedic Surgery

## 2012-10-09 ENCOUNTER — Ambulatory Visit (INDEPENDENT_AMBULATORY_CARE_PROVIDER_SITE_OTHER): Payer: BC Managed Care – PPO | Admitting: Orthopedic Surgery

## 2012-10-09 DIAGNOSIS — Z9889 Other specified postprocedural states: Secondary | ICD-10-CM

## 2012-10-09 DIAGNOSIS — M23322 Other meniscus derangements, posterior horn of medial meniscus, left knee: Secondary | ICD-10-CM

## 2012-10-09 DIAGNOSIS — M23329 Other meniscus derangements, posterior horn of medial meniscus, unspecified knee: Secondary | ICD-10-CM

## 2012-10-09 NOTE — Patient Instructions (Signed)
START PT    

## 2012-10-09 NOTE — Progress Notes (Signed)
Patient ID: [name], female   DOB: 1960/04/26, 52 y.o.   MRN: [id number]  No chief complaint on file.  Encounter Diagnoses  Name Primary?  . Medial meniscus, posterior horn derangement, left Yes  . S/P arthroscopy of left knee     Portal sites are clean sutures were removed patient is doing well. Recommend physical therapy weightbearing as tolerated followup in 3 weeks

## 2012-10-15 ENCOUNTER — Ambulatory Visit (HOSPITAL_COMMUNITY)
Admission: RE | Admit: 2012-10-15 | Discharge: 2012-10-15 | Disposition: A | Discharge status: Home / Self Care | Payer: BC Managed Care – PPO | Source: Ambulatory Visit | Attending: Orthopedic Surgery | Admitting: Orthopedic Surgery

## 2012-10-15 DIAGNOSIS — M25569 Pain in unspecified knee: Secondary | ICD-10-CM | POA: Insufficient documentation

## 2012-10-15 DIAGNOSIS — IMO0001 Reserved for inherently not codable concepts without codable children: Secondary | ICD-10-CM | POA: Insufficient documentation

## 2012-10-15 DIAGNOSIS — R262 Difficulty in walking, not elsewhere classified: Secondary | ICD-10-CM | POA: Insufficient documentation

## 2012-10-15 DIAGNOSIS — M25669 Stiffness of unspecified knee, not elsewhere classified: Secondary | ICD-10-CM | POA: Insufficient documentation

## 2012-10-15 NOTE — Evaluation (Signed)
Physical Therapy Evaluation  Patient Details  Name: FLARA STORTI MRN: [id number] Date of Birth: 1960-12-27  Today's Date: 10/15/2012 Time: 10:15-11:00 -   charge eval              Visit#: 1 (one time a week; check ROM if ext not improving increae to 2) of 6  Re-eval: 11/05/12 Assessment Diagnosis: Lt SARK Surgical Date: 10/12/12 Next MD Visit: 10/30/2012  Authorization: Ezequiel Essex      Past Medical History:  Past Medical History  Diagnosis Date  . Constipation   . Chronic bronchitis   . Hypertension   . Asthma   . Plantar fasciitis   . Migraines   . Allergic rhinitis   . Depression   . Anxiety   . PONV (postoperative nausea and vomiting)   . GERD (gastroesophageal reflux disease)    Past Surgical History:  Past Surgical History  Procedure Laterality Date  . Right foot      plantar fascitis  . Nasal sinus surgery    . Upper gastrointestinal endoscopy  MAY 2012 DYSPHAGIA    WU:[id number], ESO STR-SAV DIL 15 MM  . Upper gastrointestinal endoscopy  MAY 2012    SAV DIL , next one w/ propofol  . Colonoscopy  MAY 2012 ARS    SLF: NSAID COLON ULCERS, SML IH  . Tonsillectomy    . Myringotomy    . Knee arthroscopy with medial menisectomy Left 10/05/2012    Procedure: LEFT KNEE ARTHROSCOPY WITH MEDIAL MENISECTOMY;  Surgeon: Vickki Hearing, MD;  Location: [facility];  Service: Orthopedics;  Laterality: Left;    Subjective Symptoms/Limitations Symptoms: Ms. Konz states that she had surgery on 10/12/2012.  She is doing better but she is still feeling very stiff and has some pain along the lateral aspect of her knee.  She has been referred to PT to maximize her functional ability Limitations: Sitting How long can you sit comfortably?: 15 minutes How long can you stand comfortably?: Pt states she shifts her weight over to her right leg. How long can you walk comfortably?: Pt has walked for five minutes or less.  Pain Assessment Currently in Pain?:  No/denies Pain Score: 2  Pain Location: Knee Pain Orientation: Left Pain Relieving Factors: ice    Prior Functioning  Prior Function Vocation: Part time employment Vocation Requirements: shift 6 hr. standing/sitting at [employer] Leisure: Hobbies-yes (Comment) Comments: bike, hike, work out at gym  Cognition/Observation Cognition Orientation Level: Oriented X4   Assessment LLE AROM (degrees) Left Knee Extension: 15 Left Knee Flexion: 112 LLE Strength Left Hip Flexion: 5/5 Left Hip Extension: 5/5 Left Hip ABduction: 5/5 Left Hip ADduction: 5/5 Left Knee Flexion: 4/5 Left Knee Extension: 3+/5 Left Ankle Dorsiflexion: 4/5  Exercise/Treatments Mobility/Balance  Ambulation/Gait Ambulation/Gait: Yes (pt has significant limp worked on normalizing gt)   Stretches Active Hamstring Stretch: 3 reps;30 seconds   Seated Long Arc Quad: 5 reps Other Seated Knee Exercises: dorsi/plantar flextin x 10 Supine Quad Sets: 10 reps Heel Slides: 10 reps Terminal Knee Extension: 10 reps    Physical Therapy Assessment and Plan PT Assessment and Plan Pt will benefit from skilled therapeutic intervention in order to improve on the following deficits: Abnormal gait;Pain;Decreased balance;Decreased activity tolerance;Decreased strength;Difficulty walking;Decreased range of motion Rehab Potential: Good PT Frequency: Min 1X/week (increase to 2x/ week if motion does not improve.) PT Duration: 4 weeks PT Plan: Due to high co-pay pt will be seen one time a week progressing as needed.  Pt will need new exercise  sheet.  Make sure extension is improving and check gait.  Pt will need lateral, forward step up, Warrior I, II, and wall squats.    Goals Home Exercise Program Pt/caregiver will Perform Home Exercise Program: For increased ROM;For increased strengthening PT Short Term Goals Time to Complete Short Term Goals: 2 weeks PT Short Term Goal 1: Pt to be able to sit for 30 minutes with comfort  to enjoy a meal PT Short Term Goal 2: Pt to be able to walk for 15 minutes to be able to get a few items at the grocery store PT Short Term Goal 3: Pt ROM improved to 8-130 to allow normalized gt. PT Long Term Goals Time to Complete Long Term Goals: 4 weeks PT Long Term Goal 1: Pt to be able to sit for over an hour with comfort to be able to eat out. PT Long Term Goal 2: Pt to be able to walk for 30 minutes without pain for a healthier lifestyle Long Term Goal 3: ROM 3-130 to allow pt to be able to squt to pick itema off the floor  Problem List Patient Active Problem List   Diagnosis Date Noted  . Stiffness of joint, not elsewhere classified, lower leg 10/15/2012  . Difficulty in walking(719.7) 10/15/2012  . S/P arthroscopy of left knee 10/09/2012  . Medial meniscus, posterior horn derangement 07/31/2012  . Dysphagia 06/01/2010  . GERD (gastroesophageal reflux disease) 06/01/2010  . Change in bowel habits 06/01/2010    General Behavior During Therapy: [facility] for tasks assessed/performed PT Plan of Care PT Home Exercise Plan: given  GP    RUSSELL,CINDY 10/15/2012, 2:41 PM  Physician Documentation Your signature is required to indicate approval of the treatment plan as stated above.  Please sign and either send electronically or make a copy of this report for your files and return this physician signed original.   Please mark one 1.__approve of plan  2. ___approve of plan with the following conditions.   ______________________________                                                          _____________________ Physician Signature                                                                                                             Date

## 2012-10-22 ENCOUNTER — Ambulatory Visit (HOSPITAL_COMMUNITY)
Admission: RE | Admit: 2012-10-22 | Discharge: 2012-10-22 | Disposition: A | Discharge status: Home / Self Care | Payer: BC Managed Care – PPO | Source: Ambulatory Visit | Attending: Orthopedic Surgery | Admitting: Orthopedic Surgery

## 2012-10-22 DIAGNOSIS — R262 Difficulty in walking, not elsewhere classified: Secondary | ICD-10-CM

## 2012-10-22 DIAGNOSIS — M25669 Stiffness of unspecified knee, not elsewhere classified: Secondary | ICD-10-CM

## 2012-10-22 NOTE — Evaluation (Signed)
Physical Therapy Evaluation  Patient Details  Name: [name] MRN: [id number] Date of Birth: 06/21/1960  Today's Date: 10/22/2012 Time: 1025-1105 PT Time Calculation (min): 40 min Charge: There ex 1025-1100; ROM 1100-1005             Visit#: 2 of 2  Re-eval: 10/22/12 Assessment Diagnosis: Lt SARK Surgical Date: 10/12/12 Next MD Visit: 10/30/2012  Authorization: [employer]    Past Medical History:  Past Medical History  Diagnosis Date  . Constipation   . Chronic bronchitis   . Hypertension   . Asthma   . Plantar fasciitis   . Migraines   . Allergic rhinitis   . Depression   . Anxiety   . PONV (postoperative nausea and vomiting)   . GERD (gastroesophageal reflux disease)    Past Surgical History:  Past Surgical History  Procedure Laterality Date  . Right foot      plantar fascitis  . Nasal sinus surgery    . Upper gastrointestinal endoscopy  MAY 2012 DYSPHAGIA    NG:EXBMWUXLK/GMWNUUVOZD, ESO STR-SAV DIL 15 MM  . Upper gastrointestinal endoscopy  MAY 2012    SAV DIL , next one w/ propofol  . Colonoscopy  MAY 2012 ARS    SLF: NSAID COLON ULCERS, SML IH  . Tonsillectomy    . Myringotomy    . Knee arthroscopy with medial menisectomy Left 10/05/2012    Procedure: LEFT KNEE ARTHROSCOPY WITH MEDIAL MENISECTOMY;  Surgeon: Vickki Hearing, MD;  Location: [facility];  Service: Orthopedics;  Laterality: Left;    Subjective Symptoms/Limitations Symptoms: [name] states she has been doing her exercises and working out at [employer].  Pt comes requesting that this is her last PT visit secondary to high co-pay.   Limitations: Sitting How long can you sit comfortably?: 60 minutes was 15 How long can you stand comfortably?: 15 minutes was unable How long can you walk comfortably?: 30 minutes was 5 mintues or less Pain Assessment Currently in Pain?: No/denies (only when doing certain exercises.) Pain Location: Knee Pain Orientation: Left  Exam:  Pt ROM is now 7-125 was  15-112.  Strength is all 5/5;  Pt no longer having a significant limp as with initial evaluation. Exercise/Treatments  Ambulation/Gait Ambulation/Gait: Yes (pt no longer has a significant limp ) Standing Terminal Knee Extension: 10 reps Lateral Step Up: 10 reps Forward Step Up: 10 reps Wall Squat: 10 reps Other Standing Knee Exercises: yoga poses; Warrior I,II and tree   Physical Therapy Assessment and Plan PT Assessment and Plan Clinical Impression Statement: Pt has improved in ROM and strength.  Pt given new higher level exercise program.  Pt requests to work on knee at home secondary to her insureance having such a high copay PT Plan: D/C to [facility]     Goals Home Exercise Program Pt/caregiver will Perform Home Exercise Program: For increased ROM;For increased strengthening PT Goal: Perform Home Exercise Program - Progress: Met PT Short Term Goals PT Short Term Goal 1: Pt to be able to sit for 30 minutes with comfort to enjoy a meal PT Short Term Goal 1 - Progress: Met PT Short Term Goal 2: Pt to be able to walk for 15 minutes to be able to get a few items at the grocery store PT Short Term Goal 2 - Progress: Met PT Short Term Goal 3: Pt ROM improved to 8-130 to allow normalized gt. PT Short Term Goal 3 - Progress: Met PT Long Term Goals Time to Complete Long  Term Goals: 4 weeks PT Long Term Goal 1: Pt to be able to sit for over an hour with comfort to be able to eat out. PT Long Term Goal 1 - Progress: Progressing toward goal PT Long Term Goal 2: Pt to be able to walk for 30 minutes without pain for a healthier lifestyle PT Long Term Goal 2 - Progress: Met Long Term Goal 3: ROM 3-130 to allow pt to be able to squt to pick itema off the floor Long Term Goal 3 Progress: Progressing toward goal  Problem List Patient Active Problem List   Diagnosis Date Noted  . Stiffness of joint, not elsewhere classified, lower leg 10/15/2012  . Difficulty in walking(719.7) 10/15/2012  . S/P  arthroscopy of left knee 10/09/2012  . Medial meniscus, posterior horn derangement 07/31/2012  . Dysphagia 06/01/2010  . GERD (gastroesophageal reflux disease) 06/01/2010  . Change in bowel habits 06/01/2010    General Behavior During Therapy: [facility] for tasks assessed/performed PT Plan of Care PT Home Exercise Plan: given new higher level exercise program  GP    RUSSELL,CINDY 10/22/2012, 2:55 PM  Physician Documentation Your signature is required to indicate approval of the treatment plan as stated above.  Please sign and either send electronically or make a copy of this report for your files and return this physician signed original.   Please mark one 1.__approve of plan  2. ___approve of plan with the following conditions.   ______________________________                                                          _____________________ Physician Signature                                                                                                             Date

## 2012-10-29 ENCOUNTER — Ambulatory Visit (HOSPITAL_COMMUNITY): Payer: BC Managed Care – PPO | Admitting: Physical Therapy

## 2012-10-30 ENCOUNTER — Other Ambulatory Visit: Payer: Self-pay | Admitting: Family Medicine

## 2012-10-30 ENCOUNTER — Encounter: Payer: Self-pay | Admitting: Orthopedic Surgery

## 2012-10-30 ENCOUNTER — Ambulatory Visit (INDEPENDENT_AMBULATORY_CARE_PROVIDER_SITE_OTHER): Payer: BC Managed Care – PPO | Admitting: Orthopedic Surgery

## 2012-10-30 VITALS — BP 120/77 | Ht 67.0 in | Wt 154.0 lb

## 2012-10-30 DIAGNOSIS — Z9889 Other specified postprocedural states: Secondary | ICD-10-CM

## 2012-10-30 DIAGNOSIS — M23329 Other meniscus derangements, posterior horn of medial meniscus, unspecified knee: Secondary | ICD-10-CM

## 2012-10-30 DIAGNOSIS — M23322 Other meniscus derangements, posterior horn of medial meniscus, left knee: Secondary | ICD-10-CM

## 2012-10-30 NOTE — Patient Instructions (Signed)
Can return to work 11/01/12 with no squatting, use stool Ice if swollen Continue home exercises

## 2012-10-30 NOTE — Progress Notes (Signed)
Patient ID: [name], female   DOB: 23-Apr-1960, 52 y.o.   MRN: [id number]  Chief Complaint  Patient presents with  . Follow-up    3 week follow up post op SALK DOS 10/05/12    HISTORY: This is postoperative day #25 status post arthroscopy left knee partial medial meniscectomy the patient tried home exercises because of the large co-pay for physical therapy she did reasonably well still complains of swelling and soreness and inability to make quick sudden change of direction  She does have a slight joint effusion no tenderness in the knee pain when she goes to 120 of flexion and she has straight leg raise weakness without extensor lag  Recommend continue home exercise program returned to work no squatting return in one month for followup

## 2012-11-05 ENCOUNTER — Ambulatory Visit (HOSPITAL_COMMUNITY): Payer: BC Managed Care – PPO | Admitting: *Deleted

## 2012-11-29 ENCOUNTER — Ambulatory Visit: Payer: BC Managed Care – PPO | Admitting: Orthopedic Surgery

## 2012-12-03 ENCOUNTER — Other Ambulatory Visit: Payer: Self-pay | Admitting: Family Medicine

## 2012-12-04 ENCOUNTER — Encounter: Payer: Self-pay | Admitting: Orthopedic Surgery

## 2012-12-04 ENCOUNTER — Ambulatory Visit (INDEPENDENT_AMBULATORY_CARE_PROVIDER_SITE_OTHER): Payer: BC Managed Care – PPO | Admitting: Orthopedic Surgery

## 2012-12-04 DIAGNOSIS — Z9889 Other specified postprocedural states: Secondary | ICD-10-CM

## 2012-12-04 NOTE — Patient Instructions (Signed)
aspercreme and ice   Home exercises   No single leg squats x 1 month

## 2012-12-04 NOTE — Progress Notes (Signed)
Patient ID: [name], female   DOB: Jun 25, 1960, 52 y.o.   MRN: [id number]   8 weeks status post knee arthroscopy left knee doing well has some mild bursal pain medially and quadriceps tendon insertion of pain. I advised her to stop doing open chain exercises and continue closed chain exercise otherwise knee looks good no swelling full range of motion negative McMurray sign and again tenderness over the pedis anserine bursa  She can resume all normal activities  Followup as needed

## 2012-12-06 ENCOUNTER — Other Ambulatory Visit: Payer: Self-pay

## 2012-12-18 ENCOUNTER — Other Ambulatory Visit: Payer: Self-pay | Admitting: Nurse Practitioner

## 2013-01-02 ENCOUNTER — Other Ambulatory Visit: Payer: Self-pay | Admitting: Family Medicine

## 2013-01-17 ENCOUNTER — Other Ambulatory Visit: Payer: Self-pay | Admitting: Nurse Practitioner

## 2013-02-18 ENCOUNTER — Other Ambulatory Visit: Payer: Self-pay | Admitting: Nurse Practitioner

## 2013-02-25 ENCOUNTER — Ambulatory Visit (INDEPENDENT_AMBULATORY_CARE_PROVIDER_SITE_OTHER): Payer: BC Managed Care – PPO | Admitting: Nurse Practitioner

## 2013-02-25 ENCOUNTER — Encounter: Payer: Self-pay | Admitting: Nurse Practitioner

## 2013-02-25 VITALS — BP 130/90 | Ht 66.5 in | Wt 163.1 lb

## 2013-02-25 DIAGNOSIS — Z0000 Encounter for general adult medical examination without abnormal findings: Secondary | ICD-10-CM

## 2013-02-25 DIAGNOSIS — I10 Essential (primary) hypertension: Secondary | ICD-10-CM

## 2013-02-25 DIAGNOSIS — F329 Major depressive disorder, single episode, unspecified: Secondary | ICD-10-CM

## 2013-02-25 DIAGNOSIS — F411 Generalized anxiety disorder: Secondary | ICD-10-CM | POA: Insufficient documentation

## 2013-02-25 DIAGNOSIS — F419 Anxiety disorder, unspecified: Secondary | ICD-10-CM

## 2013-02-25 DIAGNOSIS — K219 Gastro-esophageal reflux disease without esophagitis: Secondary | ICD-10-CM

## 2013-02-25 DIAGNOSIS — F341 Dysthymic disorder: Secondary | ICD-10-CM

## 2013-02-25 MED ORDER — OMEPRAZOLE 20 MG PO CPDR: 20.0000 mg | DELAYED_RELEASE_CAPSULE | Freq: Every day | ORAL | Status: DC | PRN

## 2013-02-25 MED ORDER — SUMATRIPTAN SUCCINATE 50 MG PO TABS: ORAL_TABLET | ORAL | Status: DC

## 2013-02-25 MED ORDER — ALPRAZOLAM 1 MG PO TABS: 0.5000 mg | ORAL_TABLET | Freq: Every evening | ORAL | Status: DC | PRN

## 2013-02-25 MED ORDER — ALBUTEROL SULFATE HFA 108 (90 BASE) MCG/ACT IN AERS: 2.0000 | INHALATION_SPRAY | RESPIRATORY_TRACT | Status: DC | PRN

## 2013-02-25 MED ORDER — VERAPAMIL HCL ER 360 MG PO CP24: 360.0000 mg | ORAL_CAPSULE | Freq: Every day | ORAL | Status: DC

## 2013-02-25 MED ORDER — TRIAMTERENE-HCTZ 37.5-25 MG PO TABS: ORAL_TABLET | ORAL | Status: DC

## 2013-02-25 NOTE — Progress Notes (Signed)
Patient declined flu vaccine.

## 2013-02-27 LAB — LIPID PANEL
CHOLESTEROL: 230 mg/dL — AB (ref 0–200)
HDL: 110 mg/dL (ref >39)
LDL Cholesterol: 113 mg/dL — ABNORMAL HIGH (ref 0–99)
TRIGLYCERIDES: 35 mg/dL (ref <150)
Total CHOL/HDL Ratio: 2.1 Ratio
VLDL: 7 mg/dL (ref 0–40)

## 2013-02-27 LAB — TSH: TSH: 0.465 u[IU]/mL (ref 0.350–4.500)

## 2013-02-27 LAB — HEPATIC FUNCTION PANEL
ALBUMIN: 4.2 g/dL (ref 3.5–5.2)
ALT: 16 U/L (ref 0–35)
AST: 21 U/L (ref 0–37)
Alkaline Phosphatase: 76 U/L (ref 39–117)
Bilirubin, Direct: 0.1 mg/dL (ref 0.0–0.3)
Indirect Bilirubin: 0.4 mg/dL (ref 0.2–1.2)
TOTAL PROTEIN: 7.1 g/dL (ref 6.0–8.3)
Total Bilirubin: 0.5 mg/dL (ref 0.2–1.2)

## 2013-02-27 LAB — BASIC METABOLIC PANEL
BUN: 17 mg/dL (ref 6–23)
CHLORIDE: 102 meq/L (ref 96–112)
CO2: 26 meq/L (ref 19–32)
CREATININE: 0.82 mg/dL (ref 0.50–1.10)
Calcium: 10.1 mg/dL (ref 8.4–10.5)
GLUCOSE: 111 mg/dL — AB (ref 70–99)
Potassium: 4.3 mEq/L (ref 3.5–5.3)
Sodium: 139 mEq/L (ref 135–145)

## 2013-02-28 LAB — VITAMIN D 25 HYDROXY (VIT D DEFICIENCY, FRACTURES): VIT D 25 HYDROXY: 45 ng/mL (ref 30–89)

## 2013-03-03 ENCOUNTER — Encounter: Payer: Self-pay | Admitting: Nurse Practitioner

## 2013-03-03 NOTE — Progress Notes (Signed)
Subjective:  Presents for routine follow up. Recent BP outside office 117/80. Gets regular intense exercise. Healthy diet. Rare use of Xanax. Problems off/ on several months with plantar fasciitis. Rare Imitrex. No CP or SOB. Reflux stable. No abd pain. Occas use of albuterol before exercise.  Objective:   BP 130/90  Ht 5' 6.5" (1.689 m)  Wt 163 lb 2 oz (73.993 kg)  BMI 25.94 kg/m2 NAD. Alert, oriented. Lungs clear. Heart RRR. Abd soft, nontender.  Assessment: Essential hypertension, benign - Plan: Basic metabolic panel, Lipid panel  GERD (gastroesophageal reflux disease)  Anxiety and depression  Routine general medical examination at a health care facility - Plan: Hepatic function panel, Lipid panel, TSH, Vit D  25 hydroxy (rtn osteoporosis monitoring)  Plan:  Meds ordered this encounter  Medications  . DISCONTD: albuterol (VENTOLIN HFA) 108 (90 BASE) MCG/ACT inhaler    Sig: Inhale into the lungs every 6 (six) hours as needed for wheezing or shortness of breath.  . verapamil (VERELAN PM) 360 MG 24 hr capsule    Sig: Take 1 capsule (360 mg total) by mouth daily.    Dispense:  30 capsule    Refill:  5    Order Specific Question:  Supervising Provider    Answer:  Merlyn AlbertLUKING, WILLIAM S [[id number]]  . triamterene-hydrochlorothiazide (MAXZIDE-25) 37.5-25 MG per tablet    Sig: TAKE ONE TABLET DAILY.    Dispense:  30 tablet    Refill:  5    Order Specific Question:  Supervising Provider    Answer:  Merlyn AlbertLUKING, WILLIAM S [[id number]]  . SUMAtriptan (IMITREX) 50 MG tablet    Sig: One po at onset of migraine; may repeat in 2 hours if needed; max 2 per 24 hours    Dispense:  10 tablet    Refill:  5    Order Specific Question:  Supervising Provider    Answer:  Merlyn AlbertLUKING, WILLIAM S [[id number]]  . omeprazole (PRILOSEC) 20 MG capsule    Sig: Take 1 capsule (20 mg total) by mouth daily as needed (Acid Reflux).    Dispense:  30 capsule    Refill:  5    Order Specific Question:  Supervising Provider    Answer:   Merlyn AlbertLUKING, WILLIAM S [[id number]]  . ALPRAZolam (XANAX) 1 MG tablet    Sig: Take 0.5 tablets (0.5 mg total) by mouth at bedtime as needed for sleep.    Dispense:  30 tablet    Refill:  5    Order Specific Question:  Supervising Provider    Answer:  Merlyn AlbertLUKING, WILLIAM S [[id number]]  . albuterol (VENTOLIN HFA) 108 (90 BASE) MCG/ACT inhaler    Sig: Inhale 2 puffs into the lungs every 4 (four) hours as needed for wheezing or shortness of breath.    Dispense:  1 Inhaler    Refill:  5    Order Specific Question:  Supervising Provider    Answer:  Merlyn AlbertLUKING, WILLIAM S [[id number]]  Recheck in 6 months. Recommend appt with podiatry; given info. Reminded about preventive health PE.

## 2013-03-06 ENCOUNTER — Encounter: Payer: Self-pay | Admitting: Nurse Practitioner

## 2013-03-06 DIAGNOSIS — R7301 Impaired fasting glucose: Secondary | ICD-10-CM | POA: Insufficient documentation

## 2013-06-25 ENCOUNTER — Encounter (HOSPITAL_COMMUNITY): Payer: Self-pay | Admitting: Pharmacy Technician

## 2013-06-27 ENCOUNTER — Other Ambulatory Visit: Payer: Self-pay | Admitting: Podiatry

## 2013-07-04 NOTE — Addendum Note (Signed)
Addended by: Ferman Hamming on: 07/04/2013 09:09 AM   Modules accepted: Orders

## 2013-07-05 ENCOUNTER — Inpatient Hospital Stay (HOSPITAL_COMMUNITY): Admission: RE | Admit: 2013-07-05 | Payer: BC Managed Care – PPO | Source: Ambulatory Visit

## 2013-07-11 ENCOUNTER — Encounter (HOSPITAL_COMMUNITY): Admission: RE | Payer: Self-pay | Source: Ambulatory Visit

## 2013-07-11 ENCOUNTER — Ambulatory Visit (HOSPITAL_COMMUNITY): Admission: RE | Admit: 2013-07-11 | Payer: BC Managed Care – PPO | Source: Ambulatory Visit | Admitting: Podiatry

## 2013-07-11 SURGERY — FASCIOTOMY, PLANTAR, ENDOSCOPIC
Anesthesia: Monitor Anesthesia Care | Laterality: Left

## 2013-09-02 ENCOUNTER — Other Ambulatory Visit: Payer: Self-pay | Admitting: Nurse Practitioner

## 2013-09-13 ENCOUNTER — Other Ambulatory Visit: Payer: Self-pay | Admitting: Nurse Practitioner

## 2013-10-02 ENCOUNTER — Other Ambulatory Visit: Payer: Self-pay | Admitting: Family Medicine

## 2013-10-12 ENCOUNTER — Other Ambulatory Visit: Payer: Self-pay | Admitting: Family Medicine

## 2013-11-04 ENCOUNTER — Other Ambulatory Visit: Payer: Self-pay | Admitting: Family Medicine

## 2013-12-04 ENCOUNTER — Other Ambulatory Visit: Payer: Self-pay | Admitting: Family Medicine

## 2013-12-16 ENCOUNTER — Encounter: Payer: Self-pay | Admitting: Family Medicine

## 2013-12-16 ENCOUNTER — Ambulatory Visit (INDEPENDENT_AMBULATORY_CARE_PROVIDER_SITE_OTHER): Payer: BC Managed Care – PPO | Admitting: Family Medicine

## 2013-12-16 VITALS — BP 132/80 | Temp 99.0°F | Ht 66.0 in | Wt 159.0 lb

## 2013-12-16 DIAGNOSIS — J4521 Mild intermittent asthma with (acute) exacerbation: Secondary | ICD-10-CM

## 2013-12-16 MED ORDER — AZITHROMYCIN 250 MG PO TABS: ORAL_TABLET | ORAL | Status: DC

## 2013-12-16 MED ORDER — PREDNISONE 20 MG PO TABS: ORAL_TABLET | ORAL | Status: DC

## 2013-12-16 NOTE — Progress Notes (Signed)
   Subjective:    Patient ID: [name], female    DOB: 02/16/1960, 53 y.o.   MRN: [id number]  Cough This is a new problem. The current episode started in the past 7 days. Associated symptoms include a fever, headaches and wheezing. Associated symptoms comments: Chest tightness, runny nose, abd pain. Treatments tried: mucinex, tylenol. The treatment provided no relief. Her past medical history is significant for asthma and bronchitis.   Cough not prod  Felt tight  Some achiness  No hi fev  No vom or diarhea     Review of Systems  Constitutional: Positive for fever.  Respiratory: Positive for cough and wheezing.   Neurological: Positive for headaches.       Objective:   Physical Exam  Alert mild malaise. HEENT moderate nasal congestion pharynx normal lungs bilateral wheezes no tachypneasome rhonchi heart regular in rhythm.      Assessment & Plan:  Impression acute bronchitis with reactive airway plan antibiotics prescribed. Prednisone taper. Albuterol when necessary. 4 times a day. WSL

## 2013-12-17 ENCOUNTER — Other Ambulatory Visit: Payer: Self-pay | Admitting: Family Medicine

## 2013-12-30 ENCOUNTER — Other Ambulatory Visit: Payer: Self-pay | Admitting: Family Medicine

## 2014-01-06 ENCOUNTER — Encounter: Payer: Self-pay | Admitting: Nurse Practitioner

## 2014-01-06 ENCOUNTER — Ambulatory Visit (INDEPENDENT_AMBULATORY_CARE_PROVIDER_SITE_OTHER): Payer: BC Managed Care – PPO | Admitting: Nurse Practitioner

## 2014-01-06 VITALS — BP 138/80 | Ht 66.0 in | Wt 160.0 lb

## 2014-01-06 DIAGNOSIS — I10 Essential (primary) hypertension: Secondary | ICD-10-CM

## 2014-01-06 DIAGNOSIS — F32A Depression, unspecified: Secondary | ICD-10-CM

## 2014-01-06 DIAGNOSIS — F329 Major depressive disorder, single episode, unspecified: Secondary | ICD-10-CM

## 2014-01-06 DIAGNOSIS — F419 Anxiety disorder, unspecified: Secondary | ICD-10-CM

## 2014-01-06 DIAGNOSIS — R5383 Other fatigue: Secondary | ICD-10-CM

## 2014-01-06 DIAGNOSIS — R7301 Impaired fasting glucose: Secondary | ICD-10-CM

## 2014-01-06 DIAGNOSIS — F418 Other specified anxiety disorders: Secondary | ICD-10-CM

## 2014-01-06 DIAGNOSIS — Z79899 Other long term (current) drug therapy: Secondary | ICD-10-CM

## 2014-01-06 LAB — LIPID PANEL
Cholesterol: 221 mg/dL — ABNORMAL HIGH (ref 0–200)
HDL: 87 mg/dL (ref >39)
LDL CALC: 118 mg/dL — AB (ref 0–99)
Total CHOL/HDL Ratio: 2.5 Ratio
Triglycerides: 79 mg/dL (ref <150)
VLDL: 16 mg/dL (ref 0–40)

## 2014-01-06 LAB — BASIC METABOLIC PANEL
BUN: 22 mg/dL (ref 6–23)
CALCIUM: 9.9 mg/dL (ref 8.4–10.5)
CHLORIDE: 103 meq/L (ref 96–112)
CO2: 28 mEq/L (ref 19–32)
Creat: 0.86 mg/dL (ref 0.50–1.10)
GLUCOSE: 93 mg/dL (ref 70–99)
Potassium: 4.7 mEq/L (ref 3.5–5.3)
Sodium: 138 mEq/L (ref 135–145)

## 2014-01-06 LAB — HEMOGLOBIN A1C
Hgb A1c MFr Bld: 5.8 % — ABNORMAL HIGH (ref <5.7)
Mean Plasma Glucose: 120 mg/dL — ABNORMAL HIGH (ref <117)

## 2014-01-06 LAB — HEPATIC FUNCTION PANEL
ALK PHOS: 74 U/L (ref 39–117)
ALT: 14 U/L (ref 0–35)
AST: 19 U/L (ref 0–37)
Albumin: 4 g/dL (ref 3.5–5.2)
BILIRUBIN DIRECT: 0.1 mg/dL (ref 0.0–0.3)
BILIRUBIN TOTAL: 0.6 mg/dL (ref 0.2–1.2)
Indirect Bilirubin: 0.5 mg/dL (ref 0.2–1.2)
Total Protein: 6.9 g/dL (ref 6.0–8.3)

## 2014-01-06 MED ORDER — OMEPRAZOLE 20 MG PO CPDR: 20.0000 mg | DELAYED_RELEASE_CAPSULE | Freq: Every day | ORAL | Status: DC | PRN

## 2014-01-06 MED ORDER — VERAPAMIL HCL ER 360 MG PO CP24: ORAL_CAPSULE | ORAL | Status: DC

## 2014-01-06 MED ORDER — TRIAMTERENE-HCTZ 37.5-25 MG PO TABS: 1.0000 | ORAL_TABLET | Freq: Every day | ORAL | Status: DC

## 2014-01-06 MED ORDER — ALPRAZOLAM 1 MG PO TABS: 0.5000 mg | ORAL_TABLET | Freq: Every evening | ORAL | Status: DC | PRN

## 2014-01-06 MED ORDER — ALBUTEROL SULFATE HFA 108 (90 BASE) MCG/ACT IN AERS: 2.0000 | INHALATION_SPRAY | RESPIRATORY_TRACT | Status: DC | PRN

## 2014-01-06 MED ORDER — SUMATRIPTAN SUCCINATE 50 MG PO TABS: ORAL_TABLET | ORAL | Status: DC

## 2014-01-06 NOTE — Progress Notes (Signed)
Subjective:  Presents for routine follow up. [facility] 130-146/70-80. No CP/ischemic type pain or SOB. Working out at gym 4 days per week. Defers flu vaccine but available at work if she changes her mind. Rare migraine. Takes Xanax occasionally for sleep. Reflux stable. Takes Omeprazole prn. Plans to get routine labs this am. Rare use of albuterol; sometimes before work outs, most recently during bronchitis.  Objective:   BP 138/80 mmHg  Ht 5\' 6"  (1.676 m)  Wt 160 lb (72.576 kg)  BMI 25.84 kg/m2 NAD. Alert, oriented. Lungs clear. Heart RRR. Lower extremities no edema.  Assessment: Problem List Items Addressed This Visit      Cardiovascular and Mediastinum   Essential hypertension, benign - Primary   Relevant Medications      verapamil (VERELAN PM) 24 hr capsule      triamterene-hydrochlorothiazide (MAXZIDE-25) 37.5-25 MG per tablet   Other Relevant Orders      Lipid panel     Endocrine   Impaired fasting glucose (Chronic)   Relevant Orders      Hemoglobin A1c     Other   Anxiety and depression    Other Visit Diagnoses    High risk medication use        Relevant Orders       Hepatic function panel       Basic metabolic panel    Other fatigue        Relevant Orders       TSH       Plan:  Meds ordered this encounter  Medications  . verapamil (VERELAN PM) 360 MG 24 hr capsule    Sig: TAKE ONE CAPSULE BY MOUTH DAILY FOR BLOOD PRESSURE.    Dispense:  30 capsule    Refill:  5    Order Specific Question:  Supervising Provider    Answer:  Merlyn AlbertLUKING, WILLIAM S [[id number]]  . triamterene-hydrochlorothiazide (MAXZIDE-25) 37.5-25 MG per tablet    Sig: Take 1 tablet by mouth daily.    Dispense:  30 tablet    Refill:  5    Order Specific Question:  Supervising Provider    Answer:  Merlyn AlbertLUKING, WILLIAM S [[id number]]  . SUMAtriptan (IMITREX) 50 MG tablet    Sig: One po at onset of migraine; may repeat in 2 hours if needed; max 2 per 24 hours     Dispense:  10 tablet    Refill:  5    Order Specific Question:  Supervising Provider    Answer:  Merlyn AlbertLUKING, WILLIAM S [[id number]]  . omeprazole (PRILOSEC) 20 MG capsule    Sig: Take 1 capsule (20 mg total) by mouth daily as needed (Acid Reflux).    Dispense:  30 capsule    Refill:  5    Order Specific Question:  Supervising Provider    Answer:  Merlyn AlbertLUKING, WILLIAM S [[id number]]  . albuterol (VENTOLIN HFA) 108 (90 BASE) MCG/ACT inhaler    Sig: Inhale 2 puffs into the lungs every 4 (four) hours as needed for wheezing or shortness of breath.    Dispense:  1 Inhaler    Refill:  5    Order Specific Question:  Supervising Provider    Answer:  Merlyn AlbertLUKING, WILLIAM S [[id number]]  . ALPRAZolam (XANAX) 1 MG tablet    Sig: Take 0.5 tablets (0.5 mg total) by mouth at bedtime as needed for sleep.    Dispense:  30 tablet    Refill:  5  Order Specific Question:  Supervising Provider    Answer:  Merlyn AlbertLUKING, WILLIAM S [[id number]]   Return in about 6 months (around 07/08/2014). Strongly encouraged preventive health PE.

## 2014-01-07 LAB — TSH: TSH: 1.446 u[IU]/mL (ref 0.350–4.500)

## 2014-01-20 ENCOUNTER — Telehealth: Payer: Self-pay | Admitting: Family Medicine

## 2014-01-20 NOTE — Telephone Encounter (Signed)
Discussed with pt that carolyn sent message on her mychart. Pt states she cannot get into mychart. i discussed the results of her bloowork with her and copy was mailed to pt.

## 2014-01-20 NOTE — Telephone Encounter (Signed)
Patient says that [name] usually sends her blood work results to her with comments in the mail.  She wants to know if this is going to be done?

## 2014-07-21 ENCOUNTER — Other Ambulatory Visit: Payer: Self-pay | Admitting: Nurse Practitioner

## 2014-07-21 NOTE — Telephone Encounter (Signed)
Needs office visit.

## 2014-07-29 ENCOUNTER — Other Ambulatory Visit: Payer: Self-pay | Admitting: Nurse Practitioner

## 2014-08-25 ENCOUNTER — Other Ambulatory Visit: Payer: Self-pay | Admitting: Nurse Practitioner

## 2014-08-26 ENCOUNTER — Other Ambulatory Visit: Payer: Self-pay | Admitting: *Deleted

## 2014-08-26 MED ORDER — VERAPAMIL HCL ER 360 MG PO CP24: ORAL_CAPSULE | ORAL | Status: DC

## 2014-08-26 MED ORDER — TRIAMTERENE-HCTZ 37.5-25 MG PO TABS: 1.0000 | ORAL_TABLET | Freq: Every day | ORAL | Status: DC

## 2014-09-05 ENCOUNTER — Telehealth: Payer: Self-pay | Admitting: Nurse Practitioner

## 2014-09-05 NOTE — Telephone Encounter (Signed)
Patient [facility] along with her body fat measurements from a nutritionist.  She wrote a letter to [place].  This was placed in basket.  Please advise.

## 2014-09-08 ENCOUNTER — Other Ambulatory Visit: Payer: Self-pay | Admitting: Nurse Practitioner

## 2014-09-08 MED ORDER — VERAPAMIL HCL ER 360 MG PO CP24: ORAL_CAPSULE | ORAL | Status: DC

## 2014-09-08 MED ORDER — TRIAMTERENE-HCTZ 37.5-25 MG PO TABS: 1.0000 | ORAL_TABLET | Freq: Every day | ORAL | Status: DC

## 2014-09-08 NOTE — Telephone Encounter (Signed)
Notified patient that [name] states Brett Canales has already marked this as needing office visit, so he will need to decide when he is back in the office next week. [name] will send in 30 day supply so she will not run out. We will also send her information on to him for review. Patient verbalized understanding.

## 2014-09-08 NOTE — Telephone Encounter (Signed)
Received patient's request/letter. Brett Canales has already marked this as needing office visit, so he will need to decide when he is back in the office next week. I will send in 30 day supply so she will not run out. I will also send her information on to him for review.

## 2014-09-22 ENCOUNTER — Encounter: Payer: Self-pay | Admitting: Nurse Practitioner

## 2014-09-22 ENCOUNTER — Other Ambulatory Visit: Payer: Self-pay | Admitting: Nurse Practitioner

## 2014-09-22 ENCOUNTER — Other Ambulatory Visit: Payer: Self-pay | Admitting: Family Medicine

## 2014-09-22 MED ORDER — TRIAMTERENE-HCTZ 37.5-25 MG PO TABS: 1.0000 | ORAL_TABLET | Freq: Every day | ORAL | Status: DC

## 2014-09-22 MED ORDER — VERAPAMIL HCL ER 360 MG PO CP24: ORAL_CAPSULE | ORAL | Status: DC

## 2014-09-22 NOTE — Telephone Encounter (Signed)
Last seen on 01/07/15

## 2014-12-23 ENCOUNTER — Other Ambulatory Visit: Payer: Self-pay | Admitting: Nurse Practitioner

## 2014-12-23 NOTE — Telephone Encounter (Signed)
Last seen December 2015. May we refill?

## 2014-12-23 NOTE — Telephone Encounter (Signed)
We agreed to patient's very cranky insistence that she only be seen once per yr, 30 d refill , must be seen before further, call pt or send card to that effect in addtn to writing on rx, [name] to see ths also

## 2014-12-24 ENCOUNTER — Other Ambulatory Visit: Payer: Self-pay | Admitting: Nurse Practitioner

## 2014-12-24 ENCOUNTER — Telehealth: Payer: Self-pay | Admitting: Family Medicine

## 2014-12-24 ENCOUNTER — Other Ambulatory Visit: Payer: Self-pay | Admitting: *Deleted

## 2014-12-24 MED ORDER — VERAPAMIL HCL ER 360 MG PO CP24: ORAL_CAPSULE | ORAL | Status: DC

## 2014-12-24 NOTE — Telephone Encounter (Signed)
Med sent to pharm.pt notified on vm.

## 2014-12-24 NOTE — Telephone Encounter (Signed)
See my note fr yest either pt cal or med refill, i said thirty d of both, pt is very cranky about all this and insists on once per yr, in turn she should not make us jump thru all these hoops, no further meds after thirty days

## 2014-12-24 NOTE — Telephone Encounter (Signed)
Pt had her verapamil called in yesterday to [facility], due to her needing an appt she was only given 15. Pt states that regardless of whether she gets 15 or 30 she still has to pay the full amount of the prescription and is loosing out on money due to the fact. [facility] has agreed to honor not charging her another copay for her medicine if the other 15 can be called in. Pt is going to make an appt, but is having to wait on her work schedule to do so. Please advise.

## 2014-12-24 NOTE — Telephone Encounter (Signed)
Last seen 01/06/2014

## 2015-01-12 ENCOUNTER — Ambulatory Visit (INDEPENDENT_AMBULATORY_CARE_PROVIDER_SITE_OTHER): Payer: BC Managed Care – PPO | Admitting: Nurse Practitioner

## 2015-01-12 ENCOUNTER — Encounter: Payer: Self-pay | Admitting: Nurse Practitioner

## 2015-01-12 VITALS — BP 132/84 | Ht 66.0 in | Wt 146.0 lb

## 2015-01-12 DIAGNOSIS — R7301 Impaired fasting glucose: Secondary | ICD-10-CM

## 2015-01-12 DIAGNOSIS — I10 Essential (primary) hypertension: Secondary | ICD-10-CM

## 2015-01-12 DIAGNOSIS — K219 Gastro-esophageal reflux disease without esophagitis: Secondary | ICD-10-CM

## 2015-01-12 LAB — POCT GLYCOSYLATED HEMOGLOBIN (HGB A1C): HEMOGLOBIN A1C: 5.5

## 2015-01-12 MED ORDER — ALPRAZOLAM 1 MG PO TABS: 0.5000 mg | ORAL_TABLET | Freq: Every evening | ORAL | Status: DC | PRN

## 2015-01-12 MED ORDER — VERAPAMIL HCL ER 360 MG PO CP24: ORAL_CAPSULE | ORAL | Status: DC

## 2015-01-12 MED ORDER — ALBUTEROL SULFATE HFA 108 (90 BASE) MCG/ACT IN AERS: 2.0000 | INHALATION_SPRAY | RESPIRATORY_TRACT | Status: DC | PRN

## 2015-01-12 MED ORDER — TRIAMTERENE-HCTZ 37.5-25 MG PO TABS: 1.0000 | ORAL_TABLET | Freq: Every day | ORAL | Status: DC

## 2015-01-14 ENCOUNTER — Encounter: Payer: Self-pay | Admitting: Nurse Practitioner

## 2015-01-14 NOTE — Progress Notes (Signed)
Subjective:  Presents for her yearly visit for her hypertension. No chest pain/ischemic type pain or shortness of breath. Is seeing a nutritionist to help with her diet. Regular exercise. Rare use of omeprazole for reflux.  Objective:   BP 132/84 mmHg  Ht 5\' 6"  (1.676 m)  Wt 146 lb (66.225 kg)  BMI 23.58 kg/m2 NAD. Alert, oriented. Lungs clear. Heart regular rate rhythm. Lower extremities no edema. Abdomen soft nondistended nontender. Results for orders placed or performed in visit on 01/12/15  POCT glycosylated hemoglobin (Hb A1C)  Result Value Ref Range   Hemoglobin A1C 5.5      Assessment:  Problem List Items Addressed This Visit      Cardiovascular and Mediastinum   Essential hypertension, benign - Primary   Relevant Medications   triamterene-hydrochlorothiazide (MAXZIDE-25) 37.5-25 MG tablet   verapamil (VERELAN PM) 360 MG 24 hr capsule     Digestive   GERD (gastroesophageal reflux disease)     Endocrine   Impaired fasting glucose (Chronic)   Relevant Orders   POCT glycosylated hemoglobin (Hb A1C) (Completed)     Continue healthy diet and regular exercise. Discussed importance of yearly follow-up for high blood pressure. Strongly recommend preventive health physical and labs which patient defers at this time. Return in about 1 year (around 01/12/2016) for BP check up.

## 2015-01-28 ENCOUNTER — Encounter: Payer: Self-pay | Admitting: Family Medicine

## 2015-01-28 ENCOUNTER — Ambulatory Visit (INDEPENDENT_AMBULATORY_CARE_PROVIDER_SITE_OTHER): Payer: BC Managed Care – PPO | Admitting: Family Medicine

## 2015-01-28 VITALS — BP 130/84 | Temp 98.5°F | Ht 66.0 in | Wt 147.1 lb

## 2015-01-28 DIAGNOSIS — J4521 Mild intermittent asthma with (acute) exacerbation: Secondary | ICD-10-CM

## 2015-01-28 DIAGNOSIS — J329 Chronic sinusitis, unspecified: Secondary | ICD-10-CM | POA: Diagnosis not present

## 2015-01-28 MED ORDER — AZITHROMYCIN 250 MG PO TABS: ORAL_TABLET | ORAL | Status: DC

## 2015-01-28 MED ORDER — PREDNISONE 10 MG PO TABS: ORAL_TABLET | ORAL | Status: DC

## 2015-01-28 NOTE — Progress Notes (Signed)
   Subjective:    Patient ID: [name], female    DOB: 01/14/1961, 54 y.o.   MRN: [id number]  Fever  This is a new problem. The current episode started in the past 7 days. The problem has been unchanged. Her temperature was unmeasured prior to arrival. Associated symptoms include coughing. Associated symptoms comments: Shortness of breath. She has tried acetaminophen (alka seltzer, inhaler) for the symptoms. The treatment provided no relief.   Patient states that she has no other concerns at this time.   Felt really bad  Shaking achey and had a fever right off the start   feeloing bad  Some wheezing with it  Non productive cough  Fever off and on   Muscle achin appossible  Had a cousin who thought she had the flu   Not much headache   Review of Systems  Constitutional: Positive for fever.  Respiratory: Positive for cough.        Objective:   Physical Exam  Alert mild malaise. Vitals stable H&T mom his congestion pharynx normal lungs bilateral wheezes no acute tachypnea no crackles      Assessment & Plan:  Impression probable parainfluenza with now secondary exacerbation of asthma an element of bronchitis plan prednisone taper Ventolin when necessary antibiotics prescribed. Symptom care discussed WSL

## 2015-05-04 ENCOUNTER — Telehealth: Payer: Self-pay | Admitting: Nurse Practitioner

## 2015-05-04 MED ORDER — OMEPRAZOLE 20 MG PO CPDR: 20.0000 mg | DELAYED_RELEASE_CAPSULE | Freq: Every day | ORAL | Status: DC | PRN

## 2015-05-04 NOTE — Telephone Encounter (Signed)
Spoke with patient and informed her per protocol a refill on Omeprazole was sent into pharmacy. Patient verbalized understanding.

## 2015-05-04 NOTE — Telephone Encounter (Signed)
Patient needs Rx for omeprazole sent to her mail order pharmacy, [facility].

## 2015-12-10 ENCOUNTER — Encounter: Payer: Self-pay | Admitting: Nurse Practitioner

## 2015-12-10 ENCOUNTER — Ambulatory Visit (INDEPENDENT_AMBULATORY_CARE_PROVIDER_SITE_OTHER): Payer: BC Managed Care – PPO | Admitting: Nurse Practitioner

## 2015-12-10 VITALS — BP 108/62 | HR 52 | Ht 66.0 in | Wt 142.4 lb

## 2015-12-10 DIAGNOSIS — Z23 Encounter for immunization: Secondary | ICD-10-CM | POA: Diagnosis not present

## 2015-12-10 DIAGNOSIS — F418 Other specified anxiety disorders: Secondary | ICD-10-CM | POA: Diagnosis not present

## 2015-12-10 DIAGNOSIS — G4700 Insomnia, unspecified: Secondary | ICD-10-CM

## 2015-12-10 DIAGNOSIS — R7301 Impaired fasting glucose: Secondary | ICD-10-CM | POA: Diagnosis not present

## 2015-12-10 DIAGNOSIS — S61217A Laceration without foreign body of left little finger without damage to nail, initial encounter: Secondary | ICD-10-CM | POA: Diagnosis not present

## 2015-12-10 DIAGNOSIS — I10 Essential (primary) hypertension: Secondary | ICD-10-CM | POA: Diagnosis not present

## 2015-12-10 DIAGNOSIS — F329 Major depressive disorder, single episode, unspecified: Secondary | ICD-10-CM

## 2015-12-10 DIAGNOSIS — F419 Anxiety disorder, unspecified: Secondary | ICD-10-CM

## 2015-12-10 MED ORDER — ALPRAZOLAM 1 MG PO TABS
0.5000 mg | ORAL_TABLET | Freq: Every evening | ORAL | 5 refills | Status: DC | PRN
Start: 2015-12-10 — End: 2016-07-26

## 2015-12-10 MED ORDER — TRIAMCINOLONE ACETONIDE 0.1 % EX CREA
1.0000 | TOPICAL_CREAM | Freq: Two times a day (BID) | CUTANEOUS | 0 refills | Status: DC
Start: 2015-12-10 — End: 2015-12-10

## 2015-12-10 MED ORDER — TRIAMCINOLONE ACETONIDE 0.1 % EX CREA: 1.0000 "application " | TOPICAL_CREAM | Freq: Two times a day (BID) | CUTANEOUS | 0 refills | Status: DC

## 2015-12-11 ENCOUNTER — Encounter: Payer: Self-pay | Admitting: Nurse Practitioner

## 2015-12-11 LAB — CBC WITH DIFFERENTIAL/PLATELET
BASOS ABS: 0 10*3/uL (ref 0.0–0.2)
Basos: 1 %
EOS (ABSOLUTE): 0.3 10*3/uL (ref 0.0–0.4)
EOS: 4 %
HEMATOCRIT: 44.8 % (ref 34.0–46.6)
HEMOGLOBIN: 14.9 g/dL (ref 11.1–15.9)
IMMATURE GRANS (ABS): 0 10*3/uL (ref 0.0–0.1)
IMMATURE GRANULOCYTES: 0 %
LYMPHS ABS: 2.8 10*3/uL (ref 0.7–3.1)
LYMPHS: 47 %
MCH: 29.4 pg (ref 26.6–33.0)
MCHC: 33.3 g/dL (ref 31.5–35.7)
MCV: 88 fL (ref 79–97)
MONOCYTES: 6 %
Monocytes Absolute: 0.4 10*3/uL (ref 0.1–0.9)
NEUTROS PCT: 42 %
Neutrophils Absolute: 2.5 10*3/uL (ref 1.4–7.0)
Platelets: 257 10*3/uL (ref 150–379)
RBC: 5.07 x10E6/uL (ref 3.77–5.28)
RDW: 14 % (ref 12.3–15.4)
WBC: 6 10*3/uL (ref 3.4–10.8)

## 2015-12-11 LAB — BASIC METABOLIC PANEL
BUN/Creatinine Ratio: 21 (ref 9–23)
BUN: 22 mg/dL (ref 6–24)
CALCIUM: 10.5 mg/dL — AB (ref 8.7–10.2)
CHLORIDE: 100 mmol/L (ref 96–106)
CO2: 26 mmol/L (ref 18–29)
Creatinine, Ser: 1.06 mg/dL — ABNORMAL HIGH (ref 0.57–1.00)
GFR calc non Af Amer: 59 mL/min/{1.73_m2} — ABNORMAL LOW (ref >59)
GFR, EST AFRICAN AMERICAN: 68 mL/min/{1.73_m2} (ref >59)
GLUCOSE: 87 mg/dL (ref 65–99)
POTASSIUM: 5.3 mmol/L — AB (ref 3.5–5.2)
Sodium: 140 mmol/L (ref 134–144)

## 2015-12-11 LAB — LIPID PANEL
CHOL/HDL RATIO: 1.8 ratio (ref 0.0–4.4)
CHOLESTEROL TOTAL: 216 mg/dL — AB (ref 100–199)
HDL: 120 mg/dL (ref >39)
LDL CALC: 85 mg/dL (ref 0–99)
TRIGLYCERIDES: 53 mg/dL (ref 0–149)
VLDL CHOLESTEROL CAL: 11 mg/dL (ref 5–40)

## 2015-12-11 LAB — HEPATIC FUNCTION PANEL
ALT: 18 IU/L (ref 0–32)
AST: 23 IU/L (ref 0–40)
Albumin: 4.6 g/dL (ref 3.5–5.5)
Alkaline Phosphatase: 92 IU/L (ref 39–117)
BILIRUBIN TOTAL: 0.5 mg/dL (ref 0.0–1.2)
Bilirubin, Direct: 0.11 mg/dL (ref 0.00–0.40)
Total Protein: 7.3 g/dL (ref 6.0–8.5)

## 2015-12-11 LAB — HEMOGLOBIN A1C
Est. average glucose Bld gHb Est-mCnc: 111 mg/dL
Hgb A1c MFr Bld: 5.5 % (ref 4.8–5.6)

## 2015-12-11 NOTE — Progress Notes (Signed)
Subjective:  Presents for routine follow-up. Getting regular activity including working out and walking. Doing fairly well with her diet. Main stress is at work. Has a BP log from outside the office, blood pressure running 104-148/62-88. Most of her blood pressures are below 140/90. No edema. Had a laceration to her left small finger a few weeks ago which has healed. Tetanus status is unknown. Is due for her routine blood work. Xanax is working well for sleep.  Objective:   BP 108/62 (BP Location: Left Arm, Patient Position: Sitting, Cuff Size: Normal)   Pulse (!) 52   Ht 5\' 6"  (1.676 m)   Wt 142 lb 6.4 oz (64.6 kg)   SpO2 98%   BMI 22.98 kg/m  NAD. Alert, oriented. Lungs clear. Heart regular rate rhythm. Lower extremities no edema. Left small finger significant laceration has completely healed.  Assessment:  Problem List Items Addressed This Visit      Cardiovascular and Mediastinum   Essential hypertension, benign - Primary   Relevant Orders   CBC with Differential/Platelet (Completed)   Lipid panel (Completed)   Hepatic function panel (Completed)   Basic metabolic panel (Completed)   Hemoglobin A1c (Completed)     Endocrine   Impaired fasting glucose (Chronic)   Relevant Orders   Hemoglobin A1c (Completed)     Other   Anxiety and depression    Other Visit Diagnoses    Insomnia, unspecified type       Laceration of left little finger without foreign body without damage to nail, initial encounter healed; tetanus status unknown      Relevant Orders   Td vaccine greater than or equal to 7yo preservative free IM (Completed)   Need for Tdap vaccination         Plan:  Meds ordered this encounter  Medications  . ALPRAZolam (XANAX) 1 MG tablet    Sig: Take 0.5 tablets (0.5 mg total) by mouth at bedtime as needed for sleep.    Dispense:  30 tablet    Refill:  5    Order Specific Question:   Supervising Provider    Answer:   Merlyn AlbertLUKING, WILLIAM S [[id number]]  . DISCONTD: triamcinolone  cream (KENALOG) 0.1 %    Sig: Apply 1 application topically 2 (two) times daily. use up to 2 weeks    Dispense:  30 g    Refill:  0    Order Specific Question:   Supervising Provider    Answer:   Merlyn AlbertLUKING, WILLIAM S [[id number]]  . triamcinolone cream (KENALOG) 0.1 %    Sig: Apply 1 application topically 2 (two) times daily. use up to 2 weeks    Dispense:  30 g    Refill:  0    Order Specific Question:   Supervising Provider    Answer:   Merlyn AlbertLUKING, WILLIAM S [[id number]]   Continue Xanax as directed when necessary sleep. Discussed importance of stress reduction. Continue current medications at this time. Strongly encouraged patient to consider preventive health physical. Return in about 6 months (around 06/08/2016) for recheck.

## 2016-02-16 ENCOUNTER — Other Ambulatory Visit: Payer: Self-pay | Admitting: *Deleted

## 2016-02-16 MED ORDER — VERAPAMIL HCL ER 360 MG PO CP24: ORAL_CAPSULE | ORAL | 1 refills | Status: DC

## 2016-02-16 MED ORDER — TRIAMTERENE-HCTZ 37.5-25 MG PO TABS: 1.0000 | ORAL_TABLET | Freq: Every day | ORAL | 1 refills | Status: DC

## 2016-03-04 ENCOUNTER — Ambulatory Visit (INDEPENDENT_AMBULATORY_CARE_PROVIDER_SITE_OTHER): Payer: BC Managed Care – PPO | Admitting: Nurse Practitioner

## 2016-03-04 ENCOUNTER — Encounter: Payer: Self-pay | Admitting: Nurse Practitioner

## 2016-03-04 VITALS — BP 136/90 | Ht 65.5 in | Wt 143.1 lb

## 2016-03-04 DIAGNOSIS — I10 Essential (primary) hypertension: Secondary | ICD-10-CM

## 2016-03-04 DIAGNOSIS — K219 Gastro-esophageal reflux disease without esophagitis: Secondary | ICD-10-CM

## 2016-03-04 DIAGNOSIS — Z1151 Encounter for screening for human papillomavirus (HPV): Secondary | ICD-10-CM | POA: Diagnosis not present

## 2016-03-04 DIAGNOSIS — Z0000 Encounter for general adult medical examination without abnormal findings: Secondary | ICD-10-CM | POA: Diagnosis not present

## 2016-03-04 DIAGNOSIS — N912 Amenorrhea, unspecified: Secondary | ICD-10-CM | POA: Diagnosis not present

## 2016-03-04 DIAGNOSIS — Z124 Encounter for screening for malignant neoplasm of cervix: Secondary | ICD-10-CM | POA: Diagnosis not present

## 2016-03-04 DIAGNOSIS — R5383 Other fatigue: Secondary | ICD-10-CM

## 2016-03-04 MED ORDER — OMEPRAZOLE 20 MG PO CPDR: 20.0000 mg | DELAYED_RELEASE_CAPSULE | Freq: Every day | ORAL | 0 refills | Status: DC | PRN

## 2016-03-04 MED ORDER — ALBUTEROL SULFATE HFA 108 (90 BASE) MCG/ACT IN AERS: 2.0000 | INHALATION_SPRAY | RESPIRATORY_TRACT | 5 refills | Status: DC | PRN

## 2016-03-04 NOTE — Progress Notes (Signed)
Subjective:    Patient ID: [name], female    DOB: 1960-10-15, 56 y.o.   MRN: [id number]  HPI 56 yo female seen today for annual wellness exam.  Has not had an eye exam within the last 2 years.  Bilateral Hearing aids with no new changes.  Up to date on routine dental exams, next appt next week.  Last Mammogram has not been within the last two years.  Colonoscopy last done 2011, to be repeated in 51yrs.  Denies any changes in stools or rectal bleeding.     Review of Systems  Constitutional: Negative for activity change, appetite change, fatigue and unexpected weight change.  HENT: Negative for dental problem, ear pain, sinus pressure and sore throat.        Bilateral hearing aids  Respiratory: Negative for cough, chest tightness, shortness of breath and wheezing.   Cardiovascular: Negative for chest pain, palpitations and leg swelling.  Gastrointestinal: Negative for abdominal distention, abdominal pain, blood in stool, constipation, diarrhea, nausea and vomiting.       Frequent bloating and flatulence No esophageal burning/coughing   Genitourinary: Negative for difficulty urinating, dysuria, enuresis, frequency, genital sores, pelvic pain, urgency, vaginal bleeding, vaginal discharge and vaginal pain.  Musculoskeletal: Negative.   Neurological: Negative for dizziness and weakness.       Objective:   Physical Exam  Constitutional: She is oriented to person, place, and time. She appears well-developed and well-nourished. No distress.  HENT:  Right Ear: Tympanic membrane and external ear normal.  Left Ear: Tympanic membrane and external ear normal.  Mouth/Throat: Oropharynx is clear and moist.  Neck: Normal range of motion. Neck supple. No tracheal deviation present. No thyromegaly present.  Cardiovascular: Normal rate, regular rhythm and normal heart sounds.  Exam reveals no gallop.   No murmur heard. Pulses:      Radial pulses are 2+ on the right side, and 2+ on the left  side.  Pulmonary/Chest: Effort normal and breath sounds normal.  Abdominal: Soft. Bowel sounds are normal. She exhibits no distension. There is no tenderness.  Genitourinary: Vagina normal and uterus normal. No breast swelling, tenderness or discharge. There is no rash or lesion on the right labia. There is no rash or lesion on the left labia. Cervix exhibits no discharge. No vaginal discharge found.  Genitourinary Comments: Bimanual exam: no CMT, no obvious masses palpable  Musculoskeletal: She exhibits no edema.  Lymphadenopathy:    She has no cervical adenopathy.  Neurological: She is alert and oriented to person, place, and time.  Skin: Skin is warm and dry. No rash noted.  Psychiatric: She has a normal mood and affect. Her behavior is normal.  Vitals reviewed. Breast exam: symmetrical, no obvious palpable masses, no axill. Adenopathy       Assessment & Plan:   Problem List Items Addressed This Visit    GERD (gastroesophageal reflux disease)   Relevant Medications   omeprazole (PRILOSEC) 20 MG capsule   Essential hypertension, benign   Relevant Orders   Basic metabolic panel    Other Visit Diagnoses    Routine general medical examination at a health care facility    -  Primary   Relevant Orders   Pap IG and HPV (high risk) DNA detection   Screening for cervical cancer       Relevant Orders   Pap IG and HPV (high risk) DNA detection   Screening for HPV (human papillomavirus)       Relevant Orders  Pap IG and HPV (high risk) DNA detection   Amenorrhea       Relevant Orders   TSH   Fatigue, unspecified type       Relevant Orders   TSH   Basic metabolic panel     Meds ordered this encounter  Medications  . albuterol (VENTOLIN HFA) 108 (90 Base) MCG/ACT inhaler    Sig: Inhale 2 puffs into the lungs every 4 (four) hours as needed for wheezing or shortness of breath.    Dispense:  1 Inhaler    Refill:  5    Order Specific Question:   Supervising Provider    Answer:    Merlyn AlbertLUKING, WILLIAM S [[id number]]  . omeprazole (PRILOSEC) 20 MG capsule    Sig: Take 1 capsule (20 mg total) by mouth daily as needed (Acid Reflux).    Dispense:  90 capsule    Refill:  0    Order Specific Question:   Supervising Provider    Answer:   Merlyn AlbertLUKING, WILLIAM S [[id number]]   Declines mammogram at this time, education provided, pt will call if she changes her mind Declines eye exam at this time, education provided  Symptom care and warning signs reviewed Begin Omeperazole 20mg  once daily for 2 weeks, call if symptoms do not improve or worsen  Recommend daily calcium and vitamin D  Return in about 6 months (around 09/01/2016) for BP recheck.

## 2016-03-05 LAB — BASIC METABOLIC PANEL
BUN / CREAT RATIO: 20 (ref 9–23)
BUN: 19 mg/dL (ref 6–24)
CALCIUM: 10.3 mg/dL — AB (ref 8.7–10.2)
CHLORIDE: 98 mmol/L (ref 96–106)
CO2: 26 mmol/L (ref 18–29)
Creatinine, Ser: 0.95 mg/dL (ref 0.57–1.00)
GFR, EST AFRICAN AMERICAN: 77 mL/min/{1.73_m2} (ref >59)
GFR, EST NON AFRICAN AMERICAN: 67 mL/min/{1.73_m2} (ref >59)
Glucose: 94 mg/dL (ref 65–99)
POTASSIUM: 4.6 mmol/L (ref 3.5–5.2)
Sodium: 140 mmol/L (ref 134–144)

## 2016-03-05 LAB — TSH: TSH: 1.11 u[IU]/mL (ref 0.450–4.500)

## 2016-03-07 ENCOUNTER — Other Ambulatory Visit: Payer: Self-pay | Admitting: *Deleted

## 2016-03-07 MED ORDER — ALBUTEROL SULFATE HFA 108 (90 BASE) MCG/ACT IN AERS: 2.0000 | INHALATION_SPRAY | RESPIRATORY_TRACT | 5 refills | Status: DC | PRN

## 2016-03-08 LAB — PAP IG AND HPV HIGH-RISK
HPV, high-risk: NEGATIVE
PAP Smear Comment: 0

## 2016-03-09 ENCOUNTER — Encounter: Payer: Self-pay | Admitting: Nurse Practitioner

## 2016-03-11 ENCOUNTER — Other Ambulatory Visit: Payer: Self-pay | Admitting: Nurse Practitioner

## 2016-03-11 DIAGNOSIS — N912 Amenorrhea, unspecified: Secondary | ICD-10-CM

## 2016-03-15 LAB — FOLLICLE STIMULATING HORMONE: FSH: 57.1 m[IU]/mL

## 2016-07-26 ENCOUNTER — Other Ambulatory Visit: Payer: Self-pay | Admitting: Nurse Practitioner

## 2016-07-26 NOTE — Telephone Encounter (Signed)
Ok plus 2 ref 

## 2016-08-10 ENCOUNTER — Other Ambulatory Visit: Payer: Self-pay | Admitting: Family Medicine

## 2016-08-11 ENCOUNTER — Encounter: Payer: Self-pay | Admitting: Nurse Practitioner

## 2016-08-11 ENCOUNTER — Other Ambulatory Visit: Payer: Self-pay | Admitting: Nurse Practitioner

## 2016-09-02 ENCOUNTER — Other Ambulatory Visit: Payer: Self-pay | Admitting: Family Medicine

## 2016-09-21 ENCOUNTER — Other Ambulatory Visit: Payer: Self-pay | Admitting: Family Medicine

## 2016-10-12 ENCOUNTER — Ambulatory Visit (INDEPENDENT_AMBULATORY_CARE_PROVIDER_SITE_OTHER): Payer: BC Managed Care – PPO

## 2016-10-12 ENCOUNTER — Encounter: Payer: Self-pay | Admitting: Orthopedic Surgery

## 2016-10-12 ENCOUNTER — Ambulatory Visit (INDEPENDENT_AMBULATORY_CARE_PROVIDER_SITE_OTHER): Payer: BC Managed Care – PPO | Admitting: Orthopedic Surgery

## 2016-10-12 VITALS — BP 123/83 | HR 83 | Ht 67.0 in | Wt 141.0 lb

## 2016-10-12 DIAGNOSIS — M25511 Pain in right shoulder: Secondary | ICD-10-CM | POA: Diagnosis not present

## 2016-10-12 DIAGNOSIS — M7521 Bicipital tendinitis, right shoulder: Secondary | ICD-10-CM | POA: Diagnosis not present

## 2016-10-12 NOTE — Patient Instructions (Signed)
ADVIL 2 TABS 2 X A DAY   DBELLS NOR BARBELLS FOR CHEST   ICE  EXERCISES   REST

## 2016-10-12 NOTE — Progress Notes (Signed)
Patient ID: [name], female   DOB: 01/31/1961, 56 y.o.   MRN: [id number]  RIGHT SHOULDER PAIN X 1 YR   HPI [name] is a 56 y.o. female.   56 YO FEMALE  PAIN ANTERIOR LEFT SHOULDER X 1 YR  NO TRAUMA MILD WEAKNESS WITH FORWARD FLEXION TREATMENTS: ICE, ADVIL, GOODY POWDERS, EXERCISES , 2 RNDS JOB: OVERHEAD ACTIVITY       Review of Systems Review of Systems Normal neuro  Denies fever   Past Medical History:  Diagnosis Date  . Allergic rhinitis   . Anxiety   . Asthma   . Chronic bronchitis   . Constipation   . Depression   . GERD (gastroesophageal reflux disease)   . Hypertension   . Migraines   . Plantar fasciitis   . PONV (postoperative nausea and vomiting)     Past Surgical History:  Procedure Laterality Date  . COLONOSCOPY  MAY 2012 ARS   SLF: NSAID COLON ULCERS, SML IH  . KNEE ARTHROSCOPY WITH MEDIAL MENISECTOMY Left 10/05/2012   Procedure: LEFT KNEE ARTHROSCOPY WITH MEDIAL MENISECTOMY;  Surgeon: Vickki HearingStanley E Zyshonne Malecha, MD;  Location: [facility];  Service: Orthopedics;  Laterality: Left;  . MYRINGOTOMY    . NASAL SINUS SURGERY    . right foot     plantar fascitis  . TONSILLECTOMY    . UPPER GASTROINTESTINAL ENDOSCOPY  MAY 2012 DYSPHAGIA   NF:[id number]:GASTRITIS/DUODENITIS, ESO STR-SAV DIL 15 MM  . UPPER GASTROINTESTINAL ENDOSCOPY  MAY 2012   SAV DIL 16MM, next one w/ propofol      Family History  Problem Relation Age of Onset  . Lung cancer Mother        smoker  . Hypertension Mother   . Heart failure Father   . Colon polyps Neg Hx   . Crohn's disease Neg Hx    The patient was quizzed about their family history and reported no history of bleeding problems or anesthesia problems in their family  Social History Social History  Substance Use Topics  . Smoking status: Never Smoker  . Smokeless tobacco: Never Used  . Alcohol use No    Allergies  Allergen Reactions  . Levaquin [Levofloxacin In D5w] Nausea Only    Current Outpatient  Prescriptions  Medication Sig Dispense Refill  . albuterol (VENTOLIN HFA) 108 (90 Base) MCG/ACT inhaler Inhale 2 puffs into the lungs every 4 (four) hours as needed for wheezing or shortness of breath. 1 Inhaler 5  . ALPRAZolam (XANAX) 1 MG tablet TAKE 1/2 TABLET BY MOUTH AT BEDTIME AS NEEDED FOR SLEEP. 30 tablet 2  . omeprazole (PRILOSEC) 20 MG capsule Take 1 capsule (20 mg total) by mouth daily as needed (Acid Reflux). 90 capsule 0  . Probiotic Product (PHILLIPS COLON HEALTH PO) Take 1 capsule by mouth daily.    Marland Kitchen. triamcinolone cream (KENALOG) 0.1 % Apply 1 application topically 2 (two) times daily. use up to 2 weeks 30 g 0  . triamterene-hydrochlorothiazide (MAXZIDE-25) 37.5-25 MG tablet TAKE 1 TABLET DAILY 90 tablet 0  . verapamil (VERELAN PM) 360 MG 24 hr capsule TAKE 1 CAPSULE DAILY FOR   BLOOD PRESSURE 30 capsule 0  . verapamil (VERELAN PM) 360 MG 24 hr capsule TAKE 1 CAPSULE DAILY FOR   BLOOD PRESSURE. NEEDS      OFFICE VISIT. 30 capsule 0   No current facility-administered medications for this visit.        Physical Exam BP 123/83   Pulse 83   Ht   (1.702 m)   Wt 141 lb (64 kg)   BMI 22.08 kg/m  Physical Exam The patient is well developed well nourished and well groomed.  Orientation to person place and time is normal  Mood is pleasant.  Ambulatory status NORMAL Cervical spine exam is as follows Normal range of motion no tenderness  Ortho Exam Right shoulder  Examination: Inspection reveals tenderness around the rotator interval The patient has painful but full range of motion in flexion external rotation and internal rotation FLEXION   and grade 5/ 5 motor function of the rotator cuff. Stability in abduction external rotation is normal.   Speed test positive Yergason test negative   Neurovascular examination is intact and the lymph nodes in the axilla and supraclavicular regions are normal   The opposite shoulder (left) exhibits normal range of motion  stability and strength neurovascular exam is intact, lymph nodes are negative and there is no swelling or tenderness   Data Reviewed IMAGING: [facility]/LAT left Shoulder: I have read and interpret the x-ray as follows:   normal   Assessment    Left SHOULDER  BICEPS TENDONITIS  SHOULDER PAIN     Plan    NSAIDS Home exercises  INJECTION anterior biceps tendon Advil 2 tablets twice a day for 1-2 weeks  Change  routine TO dumbbells not barbells  Biceps tendon injection Right biceps tendon was injected The patient gave verbal consent for cortisone injection Timeout confirmed the site of injection Medications used included 40 mg of Depo-Medrol and 3 mL 1% lidocaine After alcohol and ethyl chloride preparation the point of maximal tenderness was injected over the right biceps tendon there were no complications  Fuller Canada, MD 10/12/2016 9:28 AM

## 2016-11-02 ENCOUNTER — Other Ambulatory Visit: Payer: Self-pay | Admitting: Family Medicine

## 2016-11-29 ENCOUNTER — Encounter: Payer: Self-pay | Admitting: Nurse Practitioner

## 2016-11-29 NOTE — Telephone Encounter (Signed)
Left message to return call 

## 2016-11-29 NOTE — Telephone Encounter (Signed)
Last seen 03/2016 for physical. Note states to return august for bp check. No upcoming appt scheduled.

## 2016-11-30 ENCOUNTER — Other Ambulatory Visit: Payer: Self-pay | Admitting: *Deleted

## 2016-11-30 MED ORDER — ALPRAZOLAM 1 MG PO TABS: ORAL_TABLET | ORAL | 0 refills | Status: DC

## 2016-11-30 MED ORDER — VERAPAMIL HCL ER 360 MG PO CP24: ORAL_CAPSULE | ORAL | 0 refills | Status: DC

## 2016-11-30 MED ORDER — TRIAMTERENE-HCTZ 37.5-25 MG PO TABS: 1.0000 | ORAL_TABLET | Freq: Every day | ORAL | 0 refills | Status: DC

## 2016-11-30 NOTE — Telephone Encounter (Signed)
Discussed with pt. 30 day supply sent to pharm. Pt transferred to front to schedule office visit within 3 weeks.

## 2016-12-01 ENCOUNTER — Other Ambulatory Visit: Payer: Self-pay | Admitting: Family Medicine

## 2016-12-21 ENCOUNTER — Ambulatory Visit: Payer: BC Managed Care – PPO | Admitting: Nurse Practitioner

## 2016-12-21 ENCOUNTER — Encounter: Payer: Self-pay | Admitting: Nurse Practitioner

## 2016-12-21 VITALS — BP 108/70 | Ht 65.5 in | Wt 146.0 lb

## 2016-12-21 DIAGNOSIS — Z13220 Encounter for screening for lipoid disorders: Secondary | ICD-10-CM

## 2016-12-21 DIAGNOSIS — Z79899 Other long term (current) drug therapy: Secondary | ICD-10-CM

## 2016-12-21 DIAGNOSIS — I10 Essential (primary) hypertension: Secondary | ICD-10-CM

## 2016-12-21 DIAGNOSIS — F329 Major depressive disorder, single episode, unspecified: Secondary | ICD-10-CM | POA: Diagnosis not present

## 2016-12-21 DIAGNOSIS — R5383 Other fatigue: Secondary | ICD-10-CM

## 2016-12-21 DIAGNOSIS — F419 Anxiety disorder, unspecified: Secondary | ICD-10-CM | POA: Diagnosis not present

## 2016-12-21 DIAGNOSIS — K219 Gastro-esophageal reflux disease without esophagitis: Secondary | ICD-10-CM | POA: Diagnosis not present

## 2016-12-21 DIAGNOSIS — R7301 Impaired fasting glucose: Secondary | ICD-10-CM | POA: Diagnosis not present

## 2016-12-21 MED ORDER — VERAPAMIL HCL ER 360 MG PO CP24: ORAL_CAPSULE | ORAL | 3 refills | Status: DC

## 2016-12-21 MED ORDER — ALPRAZOLAM 1 MG PO TABS: ORAL_TABLET | ORAL | 5 refills | Status: DC

## 2016-12-21 MED ORDER — TRIAMTERENE-HCTZ 37.5-25 MG PO TABS: ORAL_TABLET | ORAL | 3 refills | Status: DC

## 2016-12-21 MED ORDER — ALBUTEROL SULFATE HFA 108 (90 BASE) MCG/ACT IN AERS: 2.0000 | INHALATION_SPRAY | RESPIRATORY_TRACT | 1 refills | Status: DC | PRN

## 2016-12-23 ENCOUNTER — Encounter: Payer: Self-pay | Admitting: Nurse Practitioner

## 2016-12-23 NOTE — Progress Notes (Addendum)
Subjective: Presents for recheck on her hypertension.  Continues to workout on a regular basis.  No chest pain/ischemic type pain or shortness of breath.  Uses her albuterol inhaler mainly before exercise.  Overall healthy diet.  Does crave sugar but tries to limit this.  Minimal fatigue.  Mild head congestion with runny nose.  No coughing.  No fever.  No sore throat.  Mild ear pressure.  No TIA symptomatology.  Uses Xanax mainly for sleep, rarely will take a quarter of a pill during the day if needed.  Takes occasional Prilosec for reflux only with certain foods.  BP record from outside the office shows 118- 148/56-82.  Averaging 120-130/70s.  Objective:   BP 108/70   Ht 5' 5.5" (1.664 m)   Wt 146 lb (66.2 kg)   BMI 23.93 kg/m  NAD.  Alert, oriented.  TMs clear effusion with significant sclerotic changes bilaterally.  Patient wears hearing aids.  Pharynx clear.  Neck supple with minimal adenopathy.  Lungs clear.  Heart regular rate and rhythm.  Abdomen soft nondistended nontender.  Assessment:   Problem List Items Addressed This Visit      Cardiovascular and Mediastinum   Essential hypertension, benign - Primary   Relevant Medications   triamterene-hydrochlorothiazide (MAXZIDE-25) 37.5-25 MG tablet   verapamil (VERELAN PM) 360 MG 24 hr capsule   Other Relevant Orders   Basic metabolic panel     Digestive   GERD (gastroesophageal reflux disease)     Endocrine   Impaired fasting glucose (Chronic)   Relevant Orders   Basic metabolic panel   Hemoglobin A1c     Other   Anxiety and depression   Relevant Medications   ALPRAZolam (XANAX) 1 MG tablet    Other Visit Diagnoses    High risk medication use       Relevant Orders   Hepatic function panel   CBC with Differential/Platelet   Lipid screening       Relevant Orders   Lipid panel   Fatigue, unspecified type       Relevant Orders   VITAMIN D 25 Hydroxy (Vit-D Deficiency, Fractures)       Plan:   Meds ordered this  encounter  Medications  . triamterene-hydrochlorothiazide (MAXZIDE-25) 37.5-25 MG tablet    Sig: Take one tab daily    Dispense:  90 tablet    Refill:  3    Order Specific Question:   Supervising Provider    Answer:   Merlyn AlbertLUKING, WILLIAM S [[id number]]  . verapamil (VERELAN PM) 360 MG 24 hr capsule    Sig: TAKE 1 CAPSULE DAILY FOR   BLOOD PRESSURE.    Dispense:  90 capsule    Refill:  3    Order Specific Question:   Supervising Provider    Answer:   Merlyn AlbertLUKING, WILLIAM S [[id number]]  . albuterol (VENTOLIN HFA) 108 (90 Base) MCG/ACT inhaler    Sig: Inhale 2 puffs into the lungs every 4 (four) hours as needed for wheezing or shortness of breath.    Dispense:  3 Inhaler    Refill:  1    Please dispense preferred per insurance    Order Specific Question:   Supervising Provider    Answer:   Merlyn AlbertLUKING, WILLIAM S [[id number]]  . ALPRAZolam (XANAX) 1 MG tablet    Sig: TAKE 1 TABLET BY MOUTH AT BEDTIME AS NEEDED FOR SLEEP.    Dispense:  30 tablet    Refill:  5    May refill monthly  Order Specific Question:   Supervising Provider    Answer:   Riccardo DubinLUKING, WILLIAM S [[id number]]   Continue current medications as directed.  Continue regular activity and weight maintenance.  Patient to call back if she uses her albuterol more than 2-3 times per week for active wheezing.  Continue to use Xanax sparingly.  Strongly recommend preventive health physical in the spring.  Yearly lab work pending. Return in about 1 year (around 12/21/2017) for routine follow up. 25 minutes was spent with the patient. Greater than half the time was spent in discussion and answering questions and counseling regarding the issues that the patient came in for today.

## 2016-12-27 ENCOUNTER — Other Ambulatory Visit: Payer: Self-pay | Admitting: Family Medicine

## 2016-12-28 LAB — HEPATIC FUNCTION PANEL
ALBUMIN: 4.5 g/dL (ref 3.5–5.5)
ALK PHOS: 94 IU/L (ref 39–117)
ALT: 20 IU/L (ref 0–32)
AST: 27 IU/L (ref 0–40)
BILIRUBIN TOTAL: 0.5 mg/dL (ref 0.0–1.2)
BILIRUBIN, DIRECT: 0.14 mg/dL (ref 0.00–0.40)
TOTAL PROTEIN: 7.4 g/dL (ref 6.0–8.5)

## 2016-12-28 LAB — CBC WITH DIFFERENTIAL/PLATELET
BASOS ABS: 0 10*3/uL (ref 0.0–0.2)
Basos: 1 %
EOS (ABSOLUTE): 0.3 10*3/uL (ref 0.0–0.4)
Eos: 5 %
HEMOGLOBIN: 15.4 g/dL (ref 11.1–15.9)
Hematocrit: 43.4 % (ref 34.0–46.6)
IMMATURE GRANS (ABS): 0 10*3/uL (ref 0.0–0.1)
Immature Granulocytes: 0 %
LYMPHS: 45 %
Lymphocytes Absolute: 2.6 10*3/uL (ref 0.7–3.1)
MCH: 30.4 pg (ref 26.6–33.0)
MCHC: 35.5 g/dL (ref 31.5–35.7)
MCV: 86 fL (ref 79–97)
MONOCYTES: 7 %
Monocytes Absolute: 0.4 10*3/uL (ref 0.1–0.9)
Neutrophils Absolute: 2.4 10*3/uL (ref 1.4–7.0)
Neutrophils: 42 %
Platelets: 245 10*3/uL (ref 150–379)
RBC: 5.06 x10E6/uL (ref 3.77–5.28)
RDW: 14.1 % (ref 12.3–15.4)
WBC: 5.7 10*3/uL (ref 3.4–10.8)

## 2016-12-28 LAB — LIPID PANEL
CHOL/HDL RATIO: 1.9 ratio (ref 0.0–4.4)
Cholesterol, Total: 231 mg/dL — ABNORMAL HIGH (ref 100–199)
HDL: 123 mg/dL (ref >39)
LDL Calculated: 97 mg/dL (ref 0–99)
Triglycerides: 55 mg/dL (ref 0–149)
VLDL Cholesterol Cal: 11 mg/dL (ref 5–40)

## 2016-12-28 LAB — BASIC METABOLIC PANEL
BUN/Creatinine Ratio: 21 (ref 9–23)
BUN: 21 mg/dL (ref 6–24)
CALCIUM: 10.2 mg/dL (ref 8.7–10.2)
CHLORIDE: 100 mmol/L (ref 96–106)
CO2: 27 mmol/L (ref 20–29)
CREATININE: 1.02 mg/dL — AB (ref 0.57–1.00)
GFR calc Af Amer: 71 mL/min/{1.73_m2} (ref >59)
GFR calc non Af Amer: 61 mL/min/{1.73_m2} (ref >59)
GLUCOSE: 93 mg/dL (ref 65–99)
Potassium: 5.2 mmol/L (ref 3.5–5.2)
Sodium: 142 mmol/L (ref 134–144)

## 2016-12-28 LAB — VITAMIN D 25 HYDROXY (VIT D DEFICIENCY, FRACTURES): Vit D, 25-Hydroxy: 37.5 ng/mL (ref 30.0–100.0)

## 2016-12-28 LAB — HEMOGLOBIN A1C
ESTIMATED AVERAGE GLUCOSE: 114 mg/dL
HEMOGLOBIN A1C: 5.6 % (ref 4.8–5.6)

## 2017-01-11 ENCOUNTER — Other Ambulatory Visit: Payer: Self-pay | Admitting: Family Medicine

## 2017-01-13 ENCOUNTER — Encounter: Payer: Self-pay | Admitting: Nurse Practitioner

## 2017-01-16 ENCOUNTER — Other Ambulatory Visit: Payer: Self-pay | Admitting: Nurse Practitioner

## 2017-01-16 MED ORDER — TRIAMTERENE-HCTZ 37.5-25 MG PO TABS: ORAL_TABLET | ORAL | 3 refills | Status: DC

## 2017-08-10 ENCOUNTER — Encounter: Payer: Self-pay | Admitting: Nurse Practitioner

## 2017-08-11 ENCOUNTER — Other Ambulatory Visit: Payer: Self-pay | Admitting: Nurse Practitioner

## 2017-08-11 MED ORDER — ALPRAZOLAM 1 MG PO TABS: ORAL_TABLET | ORAL | 5 refills | Status: DC

## 2017-09-14 DIAGNOSIS — Z8669 Personal history of other diseases of the nervous system and sense organs: Secondary | ICD-10-CM | POA: Insufficient documentation

## 2017-09-14 DIAGNOSIS — H903 Sensorineural hearing loss, bilateral: Secondary | ICD-10-CM | POA: Insufficient documentation

## 2017-09-14 HISTORY — DX: Personal history of other diseases of the nervous system and sense organs: Z86.69

## 2017-12-05 ENCOUNTER — Other Ambulatory Visit: Payer: Self-pay

## 2017-12-05 MED ORDER — VERAPAMIL HCL ER 360 MG PO CP24: ORAL_CAPSULE | ORAL | 0 refills | Status: DC

## 2017-12-11 ENCOUNTER — Telehealth: Payer: Self-pay | Admitting: Family Medicine

## 2017-12-11 DIAGNOSIS — Z13220 Encounter for screening for lipoid disorders: Secondary | ICD-10-CM

## 2017-12-11 DIAGNOSIS — Z79899 Other long term (current) drug therapy: Secondary | ICD-10-CM

## 2017-12-11 DIAGNOSIS — R5383 Other fatigue: Secondary | ICD-10-CM

## 2017-12-11 DIAGNOSIS — I10 Essential (primary) hypertension: Secondary | ICD-10-CM

## 2017-12-11 DIAGNOSIS — R7301 Impaired fasting glucose: Secondary | ICD-10-CM

## 2017-12-11 NOTE — Telephone Encounter (Signed)
Last labs 12/27/16 lipid, liver, bmp, cbc, a1c, vit d

## 2017-12-11 NOTE — Telephone Encounter (Signed)
Patient is requesting labs for 6 mth followup on 12/3.

## 2017-12-12 NOTE — Telephone Encounter (Signed)
Lip liv m7 cbc A1c 

## 2017-12-12 NOTE — Telephone Encounter (Signed)
Blood work ordered in Epic. Patient notified. 

## 2017-12-21 LAB — CBC WITH DIFFERENTIAL/PLATELET
BASOS ABS: 0.1 10*3/uL (ref 0.0–0.2)
Basos: 1 %
EOS (ABSOLUTE): 0.2 10*3/uL (ref 0.0–0.4)
Eos: 3 %
Hematocrit: 43.9 % (ref 34.0–46.6)
Hemoglobin: 14.3 g/dL (ref 11.1–15.9)
Immature Grans (Abs): 0 10*3/uL (ref 0.0–0.1)
Immature Granulocytes: 0 %
Lymphocytes Absolute: 2.1 10*3/uL (ref 0.7–3.1)
Lymphs: 35 %
MCH: 29.5 pg (ref 26.6–33.0)
MCHC: 32.6 g/dL (ref 31.5–35.7)
MCV: 91 fL (ref 79–97)
MONOS ABS: 0.5 10*3/uL (ref 0.1–0.9)
Monocytes: 8 %
Neutrophils Absolute: 3.2 10*3/uL (ref 1.4–7.0)
Neutrophils: 53 %
PLATELETS: 261 10*3/uL (ref 150–450)
RBC: 4.84 x10E6/uL (ref 3.77–5.28)
RDW: 12.7 % (ref 12.3–15.4)
WBC: 6 10*3/uL (ref 3.4–10.8)

## 2017-12-21 LAB — LIPID PANEL
CHOLESTEROL TOTAL: 221 mg/dL — AB (ref 100–199)
Chol/HDL Ratio: 2 ratio (ref 0.0–4.4)
HDL: 109 mg/dL (ref >39)
LDL CALC: 101 mg/dL — AB (ref 0–99)
Triglycerides: 57 mg/dL (ref 0–149)
VLDL Cholesterol Cal: 11 mg/dL (ref 5–40)

## 2017-12-21 LAB — HEPATIC FUNCTION PANEL
ALK PHOS: 83 IU/L (ref 39–117)
ALT: 21 IU/L (ref 0–32)
AST: 26 IU/L (ref 0–40)
Albumin: 4.2 g/dL (ref 3.5–5.5)
Bilirubin Total: 0.4 mg/dL (ref 0.0–1.2)
Bilirubin, Direct: 0.1 mg/dL (ref 0.00–0.40)
Total Protein: 7 g/dL (ref 6.0–8.5)

## 2017-12-21 LAB — BASIC METABOLIC PANEL
BUN/Creatinine Ratio: 23 (ref 9–23)
BUN: 21 mg/dL (ref 6–24)
CALCIUM: 10.1 mg/dL (ref 8.7–10.2)
CHLORIDE: 99 mmol/L (ref 96–106)
CO2: 25 mmol/L (ref 20–29)
Creatinine, Ser: 0.91 mg/dL (ref 0.57–1.00)
GFR calc Af Amer: 80 mL/min/{1.73_m2} (ref >59)
GFR calc non Af Amer: 70 mL/min/{1.73_m2} (ref >59)
GLUCOSE: 92 mg/dL (ref 65–99)
POTASSIUM: 5.2 mmol/L (ref 3.5–5.2)
SODIUM: 139 mmol/L (ref 134–144)

## 2017-12-21 LAB — HEMOGLOBIN A1C
ESTIMATED AVERAGE GLUCOSE: 114 mg/dL
HEMOGLOBIN A1C: 5.6 % (ref 4.8–5.6)

## 2018-01-02 ENCOUNTER — Encounter: Payer: Self-pay | Admitting: Family Medicine

## 2018-01-02 ENCOUNTER — Ambulatory Visit: Payer: BC Managed Care – PPO | Admitting: Family Medicine

## 2018-01-02 VITALS — BP 128/86 | Ht 65.5 in | Wt 149.4 lb

## 2018-01-02 DIAGNOSIS — I10 Essential (primary) hypertension: Secondary | ICD-10-CM | POA: Diagnosis not present

## 2018-01-02 DIAGNOSIS — G4700 Insomnia, unspecified: Secondary | ICD-10-CM | POA: Diagnosis not present

## 2018-01-02 DIAGNOSIS — J4520 Mild intermittent asthma, uncomplicated: Secondary | ICD-10-CM | POA: Diagnosis not present

## 2018-01-02 MED ORDER — ALPRAZOLAM 1 MG PO TABS: ORAL_TABLET | ORAL | 5 refills | Status: DC

## 2018-01-02 MED ORDER — ALBUTEROL SULFATE HFA 108 (90 BASE) MCG/ACT IN AERS: 2.0000 | INHALATION_SPRAY | RESPIRATORY_TRACT | 1 refills | Status: DC | PRN

## 2018-01-02 MED ORDER — TRIAMTERENE-HCTZ 37.5-25 MG PO TABS: ORAL_TABLET | ORAL | 1 refills | Status: DC

## 2018-01-02 MED ORDER — VERAPAMIL HCL ER 360 MG PO CP24: ORAL_CAPSULE | ORAL | 1 refills | Status: DC

## 2018-01-02 NOTE — Progress Notes (Signed)
Subjective:    Patient ID: [name], female    DOB: March 17, 1960, 57 y.o.   MRN: [id number]  HPI Pt here today for 6 month follow up. Pt here today for follow up on BP. Pt states she is not having any problems with blood pressure. Pt states she does not have anxiety/depression.   Reports hx of chronic bronchitis and asthma, using albuterol inhaler primarily before working out or when it's damp outside. Thinks inhaler was changed and not working as well. Reports inhaler helps with her breathing when she needs it, denies frequent use. Denies any problems with wheezing or shortness of breath on a regular basis.   HTN: Compliant with medications, denies any adverse effects. Home BP readings from her congregational RN running 118-168/70-88, average typically 120-130s/70-80s. Pt denies any low BPs. No symptoms of TIA or stroke.   Xanax: taking 0.5 - 0.75 tablet at night for sleep. Reports on very rare occasion may take 1/4 tab during the day. Knows not use when driving or operating machinery. Denies any problems with anxiety or depression.   Review of Systems  Constitutional: Negative for unexpected weight change.  Eyes: Negative for visual disturbance.  Respiratory: Negative for shortness of breath.   Cardiovascular: Negative for chest pain, palpitations and leg swelling.  Gastrointestinal: Negative for abdominal pain.  Neurological: Negative for syncope, weakness, light-headedness and headaches.       Objective:   Physical Exam  Constitutional: She is oriented to person, place, and time. She appears well-developed and well-nourished. No distress.  HENT:  Head: Normocephalic and atraumatic.  Neck: Neck supple.  Cardiovascular: Normal rate, regular rhythm and normal heart sounds.  No murmur heard. Pulmonary/Chest: Effort normal and breath sounds normal. No respiratory distress.  Musculoskeletal: She exhibits no edema.  Neurological: She is alert and oriented to person, place, and  time.  Skin: Skin is warm and dry.  Psychiatric: She has a normal mood and affect.  Nursing note and vitals reviewed.  Results for orders placed or performed in visit on 12/11/17  Lipid panel  Result Value Ref Range   Cholesterol, Total 221 (H) 100 - 199 mg/dL   Triglycerides 57 0 - 149 mg/dL   HDL 409 >81 mg/dL   VLDL Cholesterol Cal 11 5 - 40 mg/dL   LDL Calculated 191 (H) 0 - 99 mg/dL   Chol/HDL Ratio 2.0 0.0 - 4.4 ratio  Hepatic function panel  Result Value Ref Range   Total Protein 7.0 6.0 - 8.5 g/dL   Albumin 4.2 3.5 - 5.5 g/dL   Bilirubin Total 0.4 0.0 - 1.2 mg/dL   Bilirubin, Direct 4.78 0.00 - 0.40 mg/dL   Alkaline Phosphatase 83 39 - 117 IU/L   AST 26 0 - 40 IU/L   ALT 21 0 - 32 IU/L  Basic metabolic panel  Result Value Ref Range   Glucose 92 65 - 99 mg/dL   BUN 21 6 - 24 mg/dL   Creatinine, Ser 2.95 0.57 - 1.00 mg/dL   GFR calc non Af Amer 70 >59 mL/min/1.73   GFR calc Af Amer 80 >59 mL/min/1.73   BUN/Creatinine Ratio 23 9 - 23   Sodium 139 134 - 144 mmol/L   Potassium 5.2 3.5 - 5.2 mmol/L   Chloride 99 96 - 106 mmol/L   CO2 25 20 - 29 mmol/L   Calcium 10.1 8.7 - 10.2 mg/dL  CBC with Differential/Platelet  Result Value Ref Range   WBC 6.0 3.4 - 10.8 x10E3/uL  RBC 4.84 3.77 - 5.28 x10E6/uL   Hemoglobin 14.3 11.1 - 15.9 g/dL   Hematocrit 16.143.9 09.634.0 - 46.6 %   MCV 91 79 - 97 fL   MCH 29.5 26.6 - 33.0 pg   MCHC 32.6 31.5 - 35.7 g/dL   RDW 04.512.7 40.912.3 - 81.115.4 %   Platelets 261 150 - 450 x10E3/uL   Neutrophils 53 Not Estab. %   Lymphs 35 Not Estab. %   Monocytes 8 Not Estab. %   Eos 3 Not Estab. %   Basos 1 Not Estab. %   Neutrophils Absolute 3.2 1.4 - 7.0 x10E3/uL   Lymphocytes Absolute 2.1 0.7 - 3.1 x10E3/uL   Monocytes Absolute 0.5 0.1 - 0.9 x10E3/uL   EOS (ABSOLUTE) 0.2 0.0 - 0.4 x10E3/uL   Basophils Absolute 0.1 0.0 - 0.2 x10E3/uL   Immature Granulocytes 0 Not Estab. %   Immature Grans (Abs) 0.0 0.0 - 0.1 x10E3/uL  Hemoglobin A1c  Result Value Ref  Range   Hgb A1c MFr Bld 5.6 4.8 - 5.6 %   Est. average glucose Bld gHb Est-mCnc 114 mg/dL       Assessment & Plan:  1. Essential hypertension, benign Recent lab work results reviewed with patient. BP well controlled on current medications, will refill. Encouraged healthy diet and exercise. She needs every 6 month f/u appts.   2. Mild intermittent asthma without complication Symptoms well-controlled with albuterol inhaler, if noticing increasing use should notify us and may need to consider a preventive inhaler at that time. Will refill medication. Recommended patient talk with her pharmacy or insurance company about the switch in inhalers, but discussed all should be albuterol.   3. Insomnia, unspecified type Patient doing well on xanax, taking up to 1 tablet at nighttime, requesting refills. PMP database checked. Will refill. She needs to f/u every 6 months for this.   Dr. Lilyan PuntScott Luking was consulted on this case and is in agreement with the above treatment plan.

## 2018-02-05 ENCOUNTER — Telehealth: Payer: Self-pay | Admitting: Family Medicine

## 2018-02-05 NOTE — Telephone Encounter (Signed)
error 

## 2018-02-06 ENCOUNTER — Encounter: Payer: Self-pay | Admitting: Family Medicine

## 2018-02-07 ENCOUNTER — Other Ambulatory Visit: Payer: Self-pay

## 2018-02-07 MED ORDER — TRIAMTERENE-HCTZ 37.5-25 MG PO TABS: ORAL_TABLET | ORAL | 1 refills | Status: DC

## 2018-07-23 ENCOUNTER — Other Ambulatory Visit: Payer: Self-pay | Admitting: Family Medicine

## 2018-07-23 NOTE — Telephone Encounter (Signed)
Schedule virtual visit May have 30

## 2018-07-24 NOTE — Telephone Encounter (Signed)
Please schedule and then send back so refill can be sent in. thanks

## 2018-07-25 NOTE — Telephone Encounter (Signed)
Left message for patient to return call to schedule appointment

## 2018-07-31 NOTE — Telephone Encounter (Signed)
lvm to call and schedule virtual visit  °

## 2018-08-15 ENCOUNTER — Other Ambulatory Visit: Payer: Self-pay

## 2018-08-16 ENCOUNTER — Encounter: Payer: Self-pay | Admitting: Family Medicine

## 2018-08-16 ENCOUNTER — Ambulatory Visit: Payer: BC Managed Care – PPO | Admitting: Family Medicine

## 2018-08-16 VITALS — BP 130/90 | Temp 97.3°F | Wt 146.4 lb

## 2018-08-16 DIAGNOSIS — I10 Essential (primary) hypertension: Secondary | ICD-10-CM | POA: Diagnosis not present

## 2018-08-16 DIAGNOSIS — G4700 Insomnia, unspecified: Secondary | ICD-10-CM | POA: Diagnosis not present

## 2018-08-16 MED ORDER — ALPRAZOLAM 1 MG PO TABS: ORAL_TABLET | ORAL | 5 refills | Status: DC

## 2018-08-16 MED ORDER — VERAPAMIL HCL ER 360 MG PO CP24: ORAL_CAPSULE | ORAL | 1 refills | Status: DC

## 2018-08-16 MED ORDER — TRIAMTERENE-HCTZ 37.5-25 MG PO TABS: ORAL_TABLET | ORAL | 1 refills | Status: DC

## 2018-08-16 MED ORDER — ALBUTEROL SULFATE HFA 108 (90 BASE) MCG/ACT IN AERS: 2.0000 | INHALATION_SPRAY | RESPIRATORY_TRACT | 5 refills | Status: DC | PRN

## 2018-08-16 NOTE — Progress Notes (Signed)
   Subjective:    Patient ID: [name], female    DOB: 17-May-1960, 58 y.o.   MRN: [id number]  Hypertension This is a chronic problem. There are no compliance problems.   pt states she use [facility] BP [facility] but has not been recently due to [place]. Pt is needing refills on Xanax 1 mg [facility]); Triamterene-HCTZ 37.5-25 mg and Verapamil 360 mg ([facility] mail order).  exercis e at leat fout iems per wk   Blood pressure medicine and blood pressure levels reviewed today with patient. Compliant with blood pressure medicine. States does not miss a dose. No obvious side effects. Blood pressure generally good when checked elsewhere. Watching salt intake.   Patient compliant with insomnia medication. Generally takes most nights. No obvious morning drowsiness. Definitely helps patient sleep. Without it patient states would not get a good nights rest.      s   Review of Systems No headache, no major weight loss or weight gain, no chest pain no back pain abdominal pain no change in bowel habits complete ROS otherwise negative     Objective:   Physical Exam   Alert vitals stable, NAD. Blood pressure good on repeat. HEENT normal. Lungs clear. Heart regular rate and rhythm.      Assessment & Plan:  Impression hypertension good control discussed to maintain  2.  Insomnia.  Clinically stable patient to maintain same medication  Meds refilled diet exercise discussed

## 2019-01-08 ENCOUNTER — Other Ambulatory Visit: Payer: Self-pay | Admitting: Family Medicine

## 2019-02-05 ENCOUNTER — Other Ambulatory Visit: Payer: Self-pay | Admitting: Family Medicine

## 2019-02-12 ENCOUNTER — Other Ambulatory Visit: Payer: Self-pay | Admitting: Family Medicine

## 2019-02-13 NOTE — Telephone Encounter (Signed)
Ok six mo worth 

## 2019-02-19 ENCOUNTER — Encounter: Payer: Self-pay | Admitting: Family Medicine

## 2019-02-19 ENCOUNTER — Ambulatory Visit: Payer: BC Managed Care – PPO | Admitting: Family Medicine

## 2019-02-19 ENCOUNTER — Other Ambulatory Visit: Payer: Self-pay

## 2019-02-19 VITALS — BP 136/88 | Temp 96.7°F | Wt 145.8 lb

## 2019-02-19 DIAGNOSIS — Z79899 Other long term (current) drug therapy: Secondary | ICD-10-CM

## 2019-02-19 DIAGNOSIS — Z13220 Encounter for screening for lipoid disorders: Secondary | ICD-10-CM

## 2019-02-19 DIAGNOSIS — L821 Other seborrheic keratosis: Secondary | ICD-10-CM

## 2019-02-19 DIAGNOSIS — I10 Essential (primary) hypertension: Secondary | ICD-10-CM | POA: Diagnosis not present

## 2019-02-19 MED ORDER — MOMETASONE FUROATE 0.1 % EX CREA: TOPICAL_CREAM | CUTANEOUS | 0 refills | Status: DC

## 2019-02-19 NOTE — Progress Notes (Signed)
   Subjective:    Patient ID: [name], female    DOB: 07-06-1960, 59 y.o.   MRN: [id number]  HPI Pt has areas on left and right side of forehead that are concerning her. Pt states the areas itch and sometime bleed when she picks at them.pt has tried Baby oil, vicks vapor rub, cortisone cream and lotion. Pt states these areas have just come up in the last few months.   Blood pressure medicine and blood pressure levels reviewed today with patient. Compliant with blood pressure medicine. States does not miss a dose. No obvious side effects. Blood pressure generally good when checked elsewhere. Watching salt intake.   bp usually pretty good  Needs  blood work       Review of Systems No chest pain no shortness of breath no fevers no abdominal pain    Objective:   Physical Exam   Alert vitals stable, NAD. Blood pressure good on repeat. HEENT normal. Lungs clear. Heart regular rate and rhythm. Bilateral seborrheic keratosis at the edge of scalp and temple region with some secondary dermatitis changes      Assessment & Plan:  Impression seborrheic keratoses.  Patient educated.  No chance of cancer.  With secondary dermatitis will cover with a steroid cream.  If patient wants to get these gone she will need to go see a dermatologist discussed  2.  Hypertension good control discussed maintain same meds appropriate blood work  Follow-up in 6 months

## 2019-02-19 NOTE — Patient Instructions (Signed)
Seborrheic keratosis Seborrheic Keratosis A seborrheic keratosis is a common, noncancerous (benign) skin growth. These growths are velvety, waxy, rough, tan, brown, or black spots that appear on the skin. These skin growths can be flat or raised, and scaly. What are the causes? The cause of this condition is not known. What increases the risk? You are more likely to develop this condition if you:  Have a family history of seborrheic keratosis.  Are 50 or older.  Are pregnant.  Have had estrogen replacement therapy. What are the signs or symptoms? Symptoms of this condition include growths on the face, chest, shoulders, back, or other areas. These growths:  Are usually painless, but may become irritated and itchy.  Can be yellow, brown, black, or other colors.  Are slightly raised or have a flat surface.  Are sometimes rough or wart-like in texture.  Are often velvety or waxy on the surface.  Are round or oval-shaped.  Often occur in groups, but may occur as a single growth. How is this diagnosed? This condition is diagnosed with a medical history and physical exam.  A sample of the growth may be tested (skin biopsy).  You may need to see a skin specialist (dermatologist). How is this treated? Treatment is not usually needed for this condition, unless the growths are irritated or bleed often.  You may also choose to have the growths removed if you do not like their appearance. ? Most commonly, these growths are treated with a procedure in which liquid nitrogen is applied to "freeze" off the growth (cryosurgery). ? They may also be burned off with electricity (electrocautery) or removed by scraping (curettage). Follow these instructions at home:  Watch your growth for any changes.  Keep all follow-up visits as told by your health care provider. This is important.  Do not scratch or pick at the growth or growths. This can cause them to become irritated or  infected. Contact a health care provider if:  You suddenly have many new growths.  Your growth bleeds, itches, or hurts.  Your growth suddenly becomes larger or changes color. Summary  A seborrheic keratosis is a common, noncancerous (benign) skin growth.  Treatment is not usually needed for this condition, unless the growths are irritated or bleed often.  Watch your growth for any changes.  Contact a health care provider if you suddenly have many new growths or your growth suddenly becomes larger or changes color.  Keep all follow-up visits as told by your health care provider. This is important. This information is not intended to replace advice given to you by your health care provider. Make sure you discuss any questions you have with your health care provider. Document Revised: 06/01/2017 Document Reviewed: 06/01/2017 Elsevier Patient Education  2020 [employer].

## 2019-02-20 LAB — LIPID PANEL
Chol/HDL Ratio: 1.8 ratio (ref 0.0–4.4)
Cholesterol, Total: 225 mg/dL — ABNORMAL HIGH (ref 100–199)
HDL: 123 mg/dL (ref >39)
LDL Chol Calc (NIH): 90 mg/dL (ref 0–99)
Triglycerides: 71 mg/dL (ref 0–149)
VLDL Cholesterol Cal: 12 mg/dL (ref 5–40)

## 2019-02-20 LAB — CBC WITH DIFFERENTIAL/PLATELET
Basophils Absolute: 0.1 10*3/uL (ref 0.0–0.2)
Basos: 1 %
EOS (ABSOLUTE): 0.2 10*3/uL (ref 0.0–0.4)
Eos: 3 %
Hematocrit: 42.8 % (ref 34.0–46.6)
Hemoglobin: 14.7 g/dL (ref 11.1–15.9)
Immature Grans (Abs): 0 10*3/uL (ref 0.0–0.1)
Immature Granulocytes: 0 %
Lymphocytes Absolute: 2.6 10*3/uL (ref 0.7–3.1)
Lymphs: 30 %
MCH: 30.2 pg (ref 26.6–33.0)
MCHC: 34.3 g/dL (ref 31.5–35.7)
MCV: 88 fL (ref 79–97)
Monocytes Absolute: 0.6 10*3/uL (ref 0.1–0.9)
Monocytes: 7 %
Neutrophils Absolute: 5.3 10*3/uL (ref 1.4–7.0)
Neutrophils: 59 %
Platelets: 257 10*3/uL (ref 150–450)
RBC: 4.86 x10E6/uL (ref 3.77–5.28)
RDW: 12.9 % (ref 11.7–15.4)
WBC: 8.8 10*3/uL (ref 3.4–10.8)

## 2019-02-20 LAB — BASIC METABOLIC PANEL
BUN/Creatinine Ratio: 20 (ref 12–28)
BUN: 17 mg/dL (ref 8–27)
CO2: 25 mmol/L (ref 20–29)
Calcium: 10.3 mg/dL (ref 8.7–10.3)
Chloride: 98 mmol/L (ref 96–106)
Creatinine, Ser: 0.87 mg/dL (ref 0.57–1.00)
GFR calc Af Amer: 84 mL/min/{1.73_m2} (ref >59)
GFR calc non Af Amer: 73 mL/min/{1.73_m2} (ref >59)
Glucose: 90 mg/dL (ref 65–99)
Potassium: 4.9 mmol/L (ref 3.5–5.2)
Sodium: 137 mmol/L (ref 134–144)

## 2019-02-20 LAB — HEPATIC FUNCTION PANEL
ALT: 15 IU/L (ref 0–32)
AST: 25 IU/L (ref 0–40)
Albumin: 4.4 g/dL (ref 3.8–4.9)
Alkaline Phosphatase: 90 IU/L (ref 39–117)
Bilirubin Total: 0.5 mg/dL (ref 0.0–1.2)
Bilirubin, Direct: 0.12 mg/dL (ref 0.00–0.40)
Total Protein: 7.4 g/dL (ref 6.0–8.5)

## 2019-02-24 ENCOUNTER — Encounter: Payer: Self-pay | Admitting: Family Medicine

## 2019-03-06 ENCOUNTER — Encounter: Payer: Self-pay | Admitting: Family Medicine

## 2019-04-15 ENCOUNTER — Other Ambulatory Visit: Payer: Self-pay

## 2019-04-15 ENCOUNTER — Ambulatory Visit (INDEPENDENT_AMBULATORY_CARE_PROVIDER_SITE_OTHER): Payer: BC Managed Care – PPO | Admitting: Family Medicine

## 2019-04-15 DIAGNOSIS — J029 Acute pharyngitis, unspecified: Secondary | ICD-10-CM | POA: Diagnosis not present

## 2019-04-15 DIAGNOSIS — Z20822 Contact with and (suspected) exposure to covid-19: Secondary | ICD-10-CM

## 2019-04-15 DIAGNOSIS — I889 Nonspecific lymphadenitis, unspecified: Secondary | ICD-10-CM | POA: Diagnosis not present

## 2019-04-15 MED ORDER — AZITHROMYCIN 250 MG PO TABS: ORAL_TABLET | ORAL | 0 refills | Status: DC

## 2019-04-15 MED ORDER — MAGIC MOUTHWASH: 15.0000 mL | Freq: Four times a day (QID) | ORAL | 0 refills | Status: DC

## 2019-04-15 NOTE — Progress Notes (Deleted)
   Subjective:    Patient ID: [name], female    DOB: [birth year], 59 y.o.   MRN: [id number]  HPI    Review of Systems     Objective:   Physical Exam        Assessment & Plan:

## 2019-04-15 NOTE — Progress Notes (Signed)
   Subjective:    Patient ID: [name], female    DOB: [birth year], 59 y.o.   MRN: [id number]  Sore Throat  This is a new problem. The current episode started in the past 7 days. Associated symptoms include congestion, swollen glands and trouble swallowing.   Week ago  mon eve n tue  Pt had some oainful throat  Very sore on the outside  Painful with swallowing  Felt   Head feels clear  Asthma has not flared up  With job pt is out in the public, doing some exposures  Pt went to the aph carrying pt  Works at [employer]       Review of Systems  HENT: Positive for congestion and trouble swallowing.    Virtual Visit via Video Note  I connected with [name] on 04/15/19 at  9:00 AM EDT by a video enabled telemedicine application and verified that I am speaking with the correct person using two identifiers.  Location: Patient: home Provider: office   I discussed the limitations of evaluation and management by telemedicine and the availability of in person appointments. The patient expressed understanding and agreed to proceed.  History of Present Illness:    Observations/Objective:   Assessment and Plan:   Follow Up Instructions:    I discussed the assessment and treatment plan with the patient. The patient was provided an opportunity to ask questions and all were answered. The patient agreed with the plan and demonstrated an understanding of the instructions.   The patient was advised to call back or seek an in-person evaluation if the symptoms worsen or if the condition fails to improve as anticipated.  I provided 23 inutes of non-face-to-face time during this encounter.   Patient clearing throat frequently during exam.     Objective:   Physical Exam  Virtual      Assessment & Plan:  Impression acute symptoms with impressive pharyngitis the past week accompanied by drainage postnasal drip.  May represent transient viral illness.   Will cover with antibiotic for potential of strep or other bacterial infection of the throat.  Strongly recommend patient get send away COVID-19 testing, of note patient has had 2 vaccines and finished the last one several weeks ago she works at [employer] with positive exposures.

## 2019-05-30 DIAGNOSIS — K219 Gastro-esophageal reflux disease without esophagitis: Secondary | ICD-10-CM | POA: Insufficient documentation

## 2019-06-13 ENCOUNTER — Other Ambulatory Visit: Payer: Self-pay | Admitting: Family Medicine

## 2019-08-20 ENCOUNTER — Telehealth: Payer: Self-pay | Admitting: *Deleted

## 2019-08-20 MED ORDER — VERAPAMIL HCL ER 360 MG PO CP24: ORAL_CAPSULE | ORAL | 1 refills | Status: DC

## 2019-08-20 NOTE — Telephone Encounter (Signed)
Mailbox has not been set up

## 2019-08-20 NOTE — Telephone Encounter (Signed)
Refill request from [facility] for Verapamil

## 2019-08-26 ENCOUNTER — Other Ambulatory Visit: Payer: Self-pay | Admitting: *Deleted

## 2019-08-26 MED ORDER — TRIAMTERENE-HCTZ 37.5-25 MG PO TABS: 1.0000 | ORAL_TABLET | Freq: Every day | ORAL | 1 refills | Status: DC

## 2019-08-26 NOTE — Telephone Encounter (Signed)
Scheduled 8/31

## 2019-10-01 ENCOUNTER — Other Ambulatory Visit: Payer: Self-pay

## 2019-10-01 ENCOUNTER — Encounter: Payer: Self-pay | Admitting: Family Medicine

## 2019-10-01 ENCOUNTER — Ambulatory Visit (INDEPENDENT_AMBULATORY_CARE_PROVIDER_SITE_OTHER): Payer: BC Managed Care – PPO | Admitting: Family Medicine

## 2019-10-01 VITALS — BP 132/80 | HR 55 | Temp 97.3°F | Ht 65.5 in | Wt 144.0 lb

## 2019-10-01 DIAGNOSIS — R7301 Impaired fasting glucose: Secondary | ICD-10-CM

## 2019-10-01 DIAGNOSIS — J4520 Mild intermittent asthma, uncomplicated: Secondary | ICD-10-CM | POA: Diagnosis not present

## 2019-10-01 DIAGNOSIS — G4700 Insomnia, unspecified: Secondary | ICD-10-CM | POA: Diagnosis not present

## 2019-10-01 DIAGNOSIS — I10 Essential (primary) hypertension: Secondary | ICD-10-CM | POA: Diagnosis not present

## 2019-10-01 MED ORDER — TRAZODONE HCL 50 MG PO TABS: ORAL_TABLET | ORAL | 0 refills | Status: DC

## 2019-10-01 MED ORDER — ALBUTEROL SULFATE HFA 108 (90 BASE) MCG/ACT IN AERS: 2.0000 | INHALATION_SPRAY | RESPIRATORY_TRACT | 1 refills | Status: DC | PRN

## 2019-10-01 MED ORDER — TRIAMTERENE-HCTZ 37.5-25 MG PO TABS: 1.0000 | ORAL_TABLET | Freq: Every day | ORAL | 1 refills | Status: DC

## 2019-10-01 NOTE — Progress Notes (Signed)
Patient ID: [name], female    DOB: 12/19/1960, 59 y.o.   MRN: [id number]   Chief Complaint  Patient presents with  . Hypertension   Subjective:    HPI  pt arrives to establish care.  Pt f/u on HTN and insomnia.  Anxiety- Pt requesting refill on xanax and bp meds. Pt taking xanax-1/2- 3/4 each night for sleep. Get to sleep well and then get up to use bathroom and can go back to sleep. Has been on it for years. Using tylenol pm in the past.   No new concerns.  HTN Pt compliant with BP meds.  No SEs Denies chest pain, sob, LE swelling, or blurry vision.   Medical History [place] has a past medical history of Allergic rhinitis, Anxiety, Asthma, Chronic bronchitis, Constipation, Depression, GERD (gastroesophageal reflux disease), Hypertension, Migraines, Plantar fasciitis, and PONV (postoperative nausea and vomiting).   Outpatient Encounter Medications as of 10/01/2019  Medication Sig  . albuterol (VENTOLIN HFA) 108 (90 Base) MCG/ACT inhaler Inhale 2 puffs into the lungs every 4 (four) hours as needed for wheezing or shortness of breath.  . ALPRAZolam (XANAX) 1 MG tablet TAKE 1/2-1 TABLET BY MOUTH AT BEDTIME AS NEEDED FOR SLEEP.  Marland Kitchen mometasone (ELOCON) 0.1 % cream Apply qhs to irritated area for 5 days prn  . Probiotic Product (PHILLIPS COLON HEALTH PO) Take 1 capsule by mouth daily.  Marland Kitchen triamterene-hydrochlorothiazide (MAXZIDE-25) 37.5-25 MG tablet Take 1 tablet by mouth daily. for blood pressure  . verapamil (VERELAN PM) 360 MG 24 hr capsule TAKE 1 CAPSULE DAILY FOR   BLOOD PRESSURE  . [DISCONTINUED] albuterol (VENTOLIN HFA) 108 (90 Base) MCG/ACT inhaler Inhale 2 puffs into the lungs every 4 (four) hours as needed for wheezing or shortness of breath.  . [DISCONTINUED] triamterene-hydrochlorothiazide (MAXZIDE-25) 37.5-25 MG tablet Take 1 tablet by mouth daily. for blood pressure  . traZODone (DESYREL) 50 MG tablet Take 1-2 tab p.o. qhs for insomnia.  . [DISCONTINUED]  azithromycin (ZITHROMAX Z-PAK) 250 MG tablet Take 2 tablets (500 mg) on  Day 1,  followed by 1 tablet (250 mg) once daily on Days 2 through 5.  . [DISCONTINUED] magic mouthwash SOLN Take 15 mLs by mouth 4 (four) times daily. Swish and gargle   No facility-administered encounter medications on file as of 10/01/2019.     Review of Systems  Constitutional: Negative for chills and fever.  HENT: Negative for congestion, rhinorrhea and sore throat.   Respiratory: Negative for cough, shortness of breath and wheezing.   Cardiovascular: Negative for chest pain and leg swelling.  Gastrointestinal: Negative for abdominal pain, diarrhea, nausea and vomiting.  Genitourinary: Negative for dysuria and frequency.  Musculoskeletal: Negative for arthralgias and back pain.  Skin: Negative for rash.  Neurological: Negative for dizziness, weakness and headaches.  Psychiatric/Behavioral: Positive for sleep disturbance. Negative for behavioral problems, confusion, decreased concentration, dysphoric mood, self-injury and suicidal ideas. The patient is not nervous/anxious.      Vitals BP 132/80   Pulse (!) 55   Temp (!) 97.3 F (36.3 C)   Ht 5' 5.5" (1.664 m)   Wt 144 lb (65.3 kg)   SpO2 98%   BMI 23.60 kg/m   Objective:   Physical Exam Vitals and nursing note reviewed.  Constitutional:      General: She is not in acute distress.    Appearance: Normal appearance. She is not ill-appearing.  HENT:     Head: Normocephalic and atraumatic.     Nose: Nose normal.  Mouth/Throat:     Mouth: Mucous membranes are moist.     Pharynx: Oropharynx is clear.  Eyes:     Extraocular Movements: Extraocular movements intact.     Conjunctiva/sclera: Conjunctivae normal.     Pupils: Pupils are equal, round, and reactive to light.  Cardiovascular:     Rate and Rhythm: Regular rhythm. Bradycardia present.     Pulses: Normal pulses.     Heart sounds: Normal heart sounds.  Pulmonary:     Effort: Pulmonary  effort is normal.     Breath sounds: Normal breath sounds. No wheezing, rhonchi or rales.  Musculoskeletal:        General: Normal range of motion.     Right lower leg: No edema.     Left lower leg: No edema.  Skin:    General: Skin is warm and dry.     Findings: No lesion or rash.  Neurological:     General: No focal deficit present.     Mental Status: She is alert and oriented to person, place, and time.  Psychiatric:        Mood and Affect: Mood normal.        Behavior: Behavior normal.      Assessment and Plan   1. Essential hypertension, benign - CBC - CMP14+EGFR - Lipid panel - triamterene-hydrochlorothiazide (MAXZIDE-25) 37.5-25 MG tablet; Take 1 tablet by mouth daily. for blood pressure  Dispense: 90 tablet; Refill: 1  2. Impaired fasting glucose - CMP14+EGFR - Lipid panel  3. Insomnia, unspecified type - traZODone (DESYREL) 50 MG tablet; Take 1-2 tab p.o. qhs for insomnia.  Dispense: 60 tablet; Refill: 0  4. Mild intermittent asthma without complication - albuterol (VENTOLIN HFA) 108 (90 Base) MCG/ACT inhaler; Inhale 2 puffs into the lungs every 4 (four) hours as needed for wheezing or shortness of breath.  Dispense: 18 g; Refill: 1    Insomnia- Discussed with pt that I don't usually prescribe long term benzodiazepines.  Pt offered to try alternative and wean off the Xanax 70m qhs. Pt willing to try trazodone for sleep and taper off the xanax.   Pt declining at this time to see psychiatry for anxiety/insomnia.  Pt to let [place] if wanting referral to psychiatry.  Pt voiced understanding.  htn- stable, cont meds. Labs ordered.  Impaired fasting glucose- labs ordered.  Asthma- intermittent, stable. Cont inhaler  F/u [id number] prn.

## 2019-10-02 LAB — CMP14+EGFR
ALT: 21 IU/L (ref 0–32)
AST: 21 IU/L (ref 0–40)
Albumin/Globulin Ratio: 1.6 (ref 1.2–2.2)
Albumin: 4.6 g/dL (ref 3.8–4.9)
Alkaline Phosphatase: 90 IU/L (ref 48–121)
BUN/Creatinine Ratio: 20 (ref 12–28)
BUN: 20 mg/dL (ref 8–27)
Bilirubin Total: 0.5 mg/dL (ref 0.0–1.2)
CO2: 21 mmol/L (ref 20–29)
Calcium: 10.3 mg/dL (ref 8.7–10.3)
Chloride: 102 mmol/L (ref 96–106)
Creatinine, Ser: 0.99 mg/dL (ref 0.57–1.00)
GFR calc Af Amer: 72 mL/min/{1.73_m2} (ref >59)
GFR calc non Af Amer: 62 mL/min/{1.73_m2} (ref >59)
Globulin, Total: 2.8 g/dL (ref 1.5–4.5)
Glucose: 96 mg/dL (ref 65–99)
Potassium: 5.4 mmol/L — ABNORMAL HIGH (ref 3.5–5.2)
Sodium: 143 mmol/L (ref 134–144)
Total Protein: 7.4 g/dL (ref 6.0–8.5)

## 2019-10-02 LAB — LIPID PANEL
Chol/HDL Ratio: 2.2 ratio (ref 0.0–4.4)
Cholesterol, Total: 250 mg/dL — ABNORMAL HIGH (ref 100–199)
HDL: 116 mg/dL (ref >39)
LDL Chol Calc (NIH): 122 mg/dL — ABNORMAL HIGH (ref 0–99)
Triglycerides: 74 mg/dL (ref 0–149)
VLDL Cholesterol Cal: 12 mg/dL (ref 5–40)

## 2019-10-02 LAB — CBC
Hematocrit: 45.8 % (ref 34.0–46.6)
Hemoglobin: 15.6 g/dL (ref 11.1–15.9)
MCH: 30.2 pg (ref 26.6–33.0)
MCHC: 34.1 g/dL (ref 31.5–35.7)
MCV: 89 fL (ref 79–97)
Platelets: 229 10*3/uL (ref 150–450)
RBC: 5.17 x10E6/uL (ref 3.77–5.28)
RDW: 12.9 % (ref 11.7–15.4)
WBC: 6.5 10*3/uL (ref 3.4–10.8)

## 2019-12-10 ENCOUNTER — Encounter (INDEPENDENT_AMBULATORY_CARE_PROVIDER_SITE_OTHER): Payer: Self-pay | Admitting: Internal Medicine

## 2019-12-10 ENCOUNTER — Other Ambulatory Visit: Payer: Self-pay

## 2019-12-10 ENCOUNTER — Ambulatory Visit (INDEPENDENT_AMBULATORY_CARE_PROVIDER_SITE_OTHER): Payer: BC Managed Care – PPO | Admitting: Internal Medicine

## 2019-12-10 VITALS — BP 132/80 | HR 63 | Temp 97.9°F | Ht 64.0 in | Wt 147.0 lb

## 2019-12-10 DIAGNOSIS — Z131 Encounter for screening for diabetes mellitus: Secondary | ICD-10-CM

## 2019-12-10 DIAGNOSIS — E559 Vitamin D deficiency, unspecified: Secondary | ICD-10-CM

## 2019-12-10 DIAGNOSIS — R5381 Other malaise: Secondary | ICD-10-CM

## 2019-12-10 DIAGNOSIS — G4700 Insomnia, unspecified: Secondary | ICD-10-CM | POA: Diagnosis not present

## 2019-12-10 DIAGNOSIS — Z13820 Encounter for screening for osteoporosis: Secondary | ICD-10-CM

## 2019-12-10 DIAGNOSIS — E2839 Other primary ovarian failure: Secondary | ICD-10-CM

## 2019-12-10 DIAGNOSIS — R5383 Other fatigue: Secondary | ICD-10-CM

## 2019-12-10 DIAGNOSIS — I10 Essential (primary) hypertension: Secondary | ICD-10-CM

## 2019-12-10 MED ORDER — ALPRAZOLAM 1 MG PO TABS
ORAL_TABLET | ORAL | 2 refills | Status: DC
Start: 2019-12-10 — End: 2020-08-24

## 2019-12-10 NOTE — Progress Notes (Signed)
Metrics: Intervention Frequency ACO  Documented Smoking Status Yearly  Screened one or more times in 24 months  Cessation Counseling or  Active cessation medication Past 24 months  Past 24 months   Guideline developer: UpToDate (See UpToDate for funding source) Date Released: 2014       Wellness Office Visit  Subjective:  Patient ID: [name], female    DOB: Jun 18, 1960  Age: 59 y.o. MRN: [id number]  CC: This 59 year old lady comes to our practice to establish care. HPI  She is interested in wellness.  She has a history of hypertension and she has insomnia and takes Xanax for this. She is 10 years post menopause.  She has never had a bone density scan. She has intermittent fatigue. She exercises on a daily basis. Past Medical History:  Diagnosis Date  . Allergic rhinitis   . Anxiety   . Asthma   . Chronic bronchitis   . Constipation   . Depression   . GERD (gastroesophageal reflux disease)   . Hypertension   . Migraines   . Plantar fasciitis   . PONV (postoperative nausea and vomiting)    Past Surgical History:  Procedure Laterality Date  . COLONOSCOPY  MAY 2012 ARS   SLF: NSAID COLON ULCERS, SML IH  . KNEE ARTHROSCOPY WITH MEDIAL MENISECTOMY Left 10/05/2012   Procedure: LEFT KNEE ARTHROSCOPY WITH MEDIAL MENISECTOMY;  Surgeon: Vickki Hearing, MD;  Location: [facility];  Service: Orthopedics;  Laterality: Left;  . MYRINGOTOMY    . NASAL SINUS SURGERY    . right foot     plantar fascitis  . TONSILLECTOMY    . UPPER GASTROINTESTINAL ENDOSCOPY  MAY 2012 DYSPHAGIA   TI:[id number], ESO STR-SAV DIL 15 MM  . UPPER GASTROINTESTINAL ENDOSCOPY  MAY 2012   SAV DIL , next one w/ propofol     Family History  Problem Relation Age of Onset  . Lung cancer Mother        smoker  . Hypertension Mother   . Heart failure Father   . Colon polyps Neg Hx   . 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   Social History   Tobacco Use  . Smoking status: Never Smoker  . Smokeless tobacco: Never Used  Substance Use Topics  . Alcohol use: No    Current Meds  Medication Sig  . albuterol (VENTOLIN HFA) 108 (90 Base) MCG/ACT inhaler Inhale 2 puffs into the lungs every 4 (four) hours as needed for wheezing or shortness of breath.  . ALPRAZolam (XANAX) 1 MG tablet TAKE 1/2-1 TABLET BY MOUTH AT BEDTIME AS NEEDED FOR SLEEP.  Marland Kitchen mometasone (ELOCON) 0.1 % cream Apply qhs to irritated area for 5 days prn  . Probiotic Product (PROBIOTIC DAILY PO) Nature's Bounty  . traZODone (DESYREL) 50 MG tablet Take 1-2 tab p.o. qhs for insomnia.  Marland Kitchen triamterene-hydrochlorothiazide (MAXZIDE-25) 37.5-25 MG tablet Take 1 tablet by mouth daily. for blood pressure  . verapamil (VERELAN PM) 360 MG 24 hr capsule TAKE 1 CAPSULE DAILY FOR   BLOOD PRESSURE  . [DISCONTINUED] ALPRAZolam (XANAX) 1 MG tablet TAKE 1/2-1 TABLET BY MOUTH AT BEDTIME AS NEEDED FOR SLEEP.      Depression screen PHQ 2/9 03/04/2016  Decreased Interest 0  Down, Depressed, Hopeless 0  PHQ - 2 Score 0     Objective:   Today's Vitals: BP  132/80   Pulse 63   Temp 97.9 F (36.6 C) (Temporal)   Ht 5\' 4"  (1.626 m)   Wt 147 lb (66.7 kg)   SpO2 98%   BMI 25.23 kg/m  Vitals with BMI 12/10/2019 10/01/2019 02/19/2019  Height 5\' 4"  5' 5.5" -  Weight 147 lbs 144 lbs 145 lbs 13 oz  BMI 25.22 23.59 -  Systolic 132 132 02/21/2019  Diastolic 80 80 88  Pulse 63 55 -     Physical Exam  She looks systemically well, relatively healthy weight.  Blood pressure in a good range.  Alert and orientated without any focal neurological signs.     Assessment   1. Insomnia, unspecified type   2. Essential hypertension, benign   3. Osteoporosis screening   4. Primary ovarian failure   5. Malaise and fatigue   6. Screening for diabetes mellitus   7. Vitamin D deficiency disease       Tests  ordered Orders Placed This Encounter  Procedures  . DG Bone Density  . CBC  . COMPLETE METABOLIC PANEL WITH GFR  . Hemoglobin A1c  . Estradiol  . Progesterone  . T3, free  . T4  . TSH  . VITAMIN D 25 Hydroxy (Vit-D Deficiency, Fractures)  . Testos,Total,Free and SHBG (Female)     Plan: 1. Blood work is ordered. 2. I will schedule her for a bone density scan. 3. I discussed the importance of mammography as a screening procedure as well as Pap smear. 4. I will see her in the next several weeks to discuss all results and further recommendations.  I spent 45 minutes with this patient, discussing the philosophy of practice and touched on confirmation bias aware as well as the importance of nutrition, exercise and hormones.   Meds ordered this encounter  Medications  . ALPRAZolam (XANAX) 1 MG tablet    Sig: TAKE 1/2-1 TABLET BY MOUTH AT BEDTIME AS NEEDED FOR SLEEP.    Dispense:  30 tablet    Refill:  2    Jaelynn Currier , MD

## 2019-12-17 LAB — HEMOGLOBIN A1C
Hgb A1c MFr Bld: 5.5 % of total Hgb (ref <5.7)
Mean Plasma Glucose: 111 (calc)
eAG (mmol/L): 6.2 (calc)

## 2019-12-17 LAB — COMPLETE METABOLIC PANEL WITH GFR
AG Ratio: 1.5 (calc) (ref 1.0–2.5)
ALT: 15 U/L (ref 6–29)
AST: 19 U/L (ref 10–35)
Albumin: 4.2 g/dL (ref 3.6–5.1)
Alkaline phosphatase (APISO): 79 U/L (ref 37–153)
BUN/Creatinine Ratio: 27 (calc) — ABNORMAL HIGH (ref 6–22)
BUN: 28 mg/dL — ABNORMAL HIGH (ref 7–25)
CO2: 27 mmol/L (ref 20–32)
Calcium: 10 mg/dL (ref 8.6–10.4)
Chloride: 101 mmol/L (ref 98–110)
Creat: 1.02 mg/dL — ABNORMAL HIGH (ref 0.50–0.99)
GFR, Est African American: 69 mL/min/{1.73_m2} (ref >=60)
GFR, Est Non African American: 60 mL/min/{1.73_m2} (ref >=60)
Globulin: 2.8 g/dL (calc) (ref 1.9–3.7)
Glucose, Bld: 96 mg/dL (ref 65–99)
Potassium: 3.5 mmol/L (ref 3.5–5.3)
Sodium: 139 mmol/L (ref 135–146)
Total Bilirubin: 0.4 mg/dL (ref 0.2–1.2)
Total Protein: 7 g/dL (ref 6.1–8.1)

## 2019-12-17 LAB — T3, FREE: T3, Free: 3 pg/mL (ref 2.3–4.2)

## 2019-12-17 LAB — CBC
HCT: 42.3 % (ref 35.0–45.0)
Hemoglobin: 14.4 g/dL (ref 11.7–15.5)
MCH: 30.4 pg (ref 27.0–33.0)
MCHC: 34 g/dL (ref 32.0–36.0)
MCV: 89.4 fL (ref 80.0–100.0)
MPV: 12.3 fL (ref 7.5–12.5)
Platelets: 258 10*3/uL (ref 140–400)
RBC: 4.73 10*6/uL (ref 3.80–5.10)
RDW: 12.6 % (ref 11.0–15.0)
WBC: 8.4 10*3/uL (ref 3.8–10.8)

## 2019-12-17 LAB — T4: T4, Total: 6.6 ug/dL (ref 5.1–11.9)

## 2019-12-17 LAB — PROGESTERONE: Progesterone: <0.5 ng/mL

## 2019-12-17 LAB — VITAMIN D 25 HYDROXY (VIT D DEFICIENCY, FRACTURES): Vit D, 25-Hydroxy: 29 ng/mL — ABNORMAL LOW (ref 30–100)

## 2019-12-17 LAB — TESTOS,TOTAL,FREE AND SHBG (FEMALE)
Free Testosterone: 2.1 pg/mL (ref 0.1–6.4)
Sex Hormone Binding: 73 nmol/L (ref 14–73)
Testosterone, Total, LC-MS-MS: 22 ng/dL (ref 2–45)

## 2019-12-17 LAB — TSH: TSH: 1.38 mIU/L (ref 0.40–4.50)

## 2019-12-17 LAB — ESTRADIOL: Estradiol: <15 pg/mL

## 2019-12-20 ENCOUNTER — Ambulatory Visit (HOSPITAL_COMMUNITY)
Admission: RE | Admit: 2019-12-20 | Discharge: 2019-12-20 | Disposition: A | Discharge status: Home / Self Care | Payer: BC Managed Care – PPO | Source: Ambulatory Visit | Attending: Internal Medicine | Admitting: Internal Medicine

## 2019-12-20 ENCOUNTER — Other Ambulatory Visit: Payer: Self-pay

## 2019-12-20 DIAGNOSIS — Z13820 Encounter for screening for osteoporosis: Secondary | ICD-10-CM | POA: Insufficient documentation

## 2020-01-22 ENCOUNTER — Encounter (INDEPENDENT_AMBULATORY_CARE_PROVIDER_SITE_OTHER): Payer: Self-pay | Admitting: Internal Medicine

## 2020-01-22 ENCOUNTER — Ambulatory Visit (INDEPENDENT_AMBULATORY_CARE_PROVIDER_SITE_OTHER): Payer: BC Managed Care – PPO | Admitting: Internal Medicine

## 2020-01-22 ENCOUNTER — Other Ambulatory Visit: Payer: Self-pay

## 2020-01-22 VITALS — BP 134/82 | HR 58 | Temp 97.2°F | Ht 64.0 in

## 2020-01-22 DIAGNOSIS — I10 Essential (primary) hypertension: Secondary | ICD-10-CM | POA: Diagnosis not present

## 2020-01-22 DIAGNOSIS — E559 Vitamin D deficiency, unspecified: Secondary | ICD-10-CM

## 2020-01-22 DIAGNOSIS — E785 Hyperlipidemia, unspecified: Secondary | ICD-10-CM

## 2020-01-22 DIAGNOSIS — M810 Age-related osteoporosis without current pathological fracture: Secondary | ICD-10-CM | POA: Diagnosis not present

## 2020-01-22 DIAGNOSIS — E2839 Other primary ovarian failure: Secondary | ICD-10-CM

## 2020-01-22 NOTE — Patient Instructions (Signed)
[name] Dietary Recommendations for Weight Loss What to Avoid . Avoid added sugars o Often added sugar can be found in processed foods such as many condiments, dry cereals, cakes, cookies, chips, crisps, crackers, candies, sweetened drinks, etc.  o Read labels and AVOID/DECREASE use of foods with the following in their ingredient list: Sugar, fructose, high fructose corn syrup, sucrose, glucose, maltose, dextrose, molasses, cane sugar, brown sugar, any type of syrup, agave nectar, etc.   . Avoid snacking in between meals . Avoid foods made with flour o If you are going to eat food made with flour, choose those made with whole-grains; and, minimize your consumption as much as is tolerable . Avoid processed foods o These foods are generally stocked in the middle of the grocery store. Focus on shopping on the perimeter of the grocery.  . Avoid Meat  o We recommend following a plant-based diet at [name]. Thus, we recommend avoiding meat as a general rule. Consider eating beans, legumes, eggs, and/or dairy products for regular protein sources o If you plan on eating meat limit to 4 ounces of meat at a time and choose lean options such as Fish, chicken, turkey. Avoid red meat intake such as pork and/or steak What to Include . Vegetables o GREEN LEAFY VEGETABLES: Kale, spinach, mustard greens, collard greens, cabbage, broccoli, etc. o OTHER: Asparagus, cauliflower, eggplant, carrots, peas, Brussel sprouts, tomatoes, bell peppers, zucchini, beets, cucumbers, etc. . Grains, seeds, and legumes o Beans: kidney beans, black eyed peas, garbanzo beans, black beans, pinto beans, etc. o Whole, unrefined grains: brown rice, barley, bulgur, oatmeal, etc. . Healthy fats  o Avoid highly processed fats such as vegetable oil o Examples of healthy fats: avocado, olives, virgin olive oil, dark chocolate (?72% Cocoa), nuts (peanuts, almonds, walnuts, cashews, pecans, etc.) . None to Low  Intake of Animal Sources of Protein o Meat sources: chicken, turkey, salmon, tuna. Limit to 4 ounces of meat at one time. o Consider limiting dairy sources, but when choosing dairy focus on: PLAIN Greek yogurt, cottage cheese, high-protein milk . Fruit o Choose berries  When to Eat . Intermittent Fasting: o Choosing not to eat for a specific time period, but DO FOCUS ON HYDRATION when fasting o Multiple Techniques: - Time Restricted Eating: eat 3 meals in a day, each meal lasting no more than 60 minutes, no snacks between meals - 16-18 hour fast: fast for 16 to 18 hours up to 7 days a week. Often suggested to start with 2-3 nonconsecutive days per week.  . Remember the time you sleep is counted as fasting.  . Examples of eating schedule: Fast from 7:00pm-11:00am. Eat between 11:00am-7:00pm.  - 24-hour fast: fast for 24 hours up to every other day. Often suggested to start with 1 day per week . Remember the time you sleep is counted as fasting . Examples of eating schedule:  o Eating day: eat 2-3 meals on your eating day. If doing 2 meals, each meal should last no more than 90 minutes. If doing 3 meals, each meal should last no more than 60 minutes. Finish last meal by 7:00pm. o Fasting day: Fast until 7:00pm.  o IF YOU FEEL UNWELL FOR ANY REASON/IN ANY WAY WHEN FASTING, STOP FASTING BY EATING A NUTRITIOUS SNACK OR LIGHT MEAL o ALWAYS FOCUS ON HYDRATION DURING FASTS - Acceptable Hydration sources: water, broths, tea/coffee (black tea/coffee is best but using a small amount of whole-fat dairy products in coffee/tea is acceptable).  -   Poor Hydration Sources: anything with sugar or artificial sweeteners added to it  These recommendations have been developed for patients that are actively receiving medical care from either Dr. Laraya Pestka or Sarah Gray, DNP, NP-C at [facility]. These recommendations are developed for patients with specific medical conditions and are not meant to be  distributed or used by others that are not actively receiving care from either provider listed above at [facility]. It is not appropriate to participate in the above eating plans without proper medical supervision.   Reference: Fung, J. The obesity code. [place]: [place]; 2016.   

## 2020-01-22 NOTE — Progress Notes (Signed)
Metrics: Intervention Frequency ACO  Documented Smoking Status Yearly  Screened one or more times in 24 months  Cessation Counseling or  Active cessation medication Past 24 months  Past 24 months   Guideline developer: UpToDate (See UpToDate for funding source) Date Released: 2014       Wellness Office Visit  Subjective:  Patient ID: [name], female    DOB: 12-Aug-1960  Age: 59 y.o. MRN: [id number]  CC: This lady comes in for follow-up regarding all her blood work and further recommendations as well as a bone density scan. HPI  Bone density scan shows osteoporosis in the spine and osteopenia in the femur.  She was surprised by this finding. She also has hypercholesterolemia. She also has vitamin D deficiency. As expected, estradiol and progesterone levels are undetectable and testosterone levels are suboptimal. Other blood work is unremarkable. Past Medical History:  Diagnosis Date  . Allergic rhinitis   . Anxiety   . Asthma   . Chronic bronchitis   . Constipation   . Depression   . GERD (gastroesophageal reflux disease)   . Hypertension   . Migraines   . Plantar fasciitis   . PONV (postoperative nausea and vomiting)    Past Surgical History:  Procedure Laterality Date  . COLONOSCOPY  MAY 2012 ARS   SLF: NSAID COLON ULCERS, SML IH  . KNEE ARTHROSCOPY WITH MEDIAL MENISECTOMY Left 10/05/2012   Procedure: LEFT KNEE ARTHROSCOPY WITH MEDIAL MENISECTOMY;  Surgeon: Vickki Hearing, MD;  Location: [facility];  Service: Orthopedics;  Laterality: Left;  . MYRINGOTOMY    . NASAL SINUS SURGERY    . right foot     plantar fascitis  . TONSILLECTOMY    . UPPER GASTROINTESTINAL ENDOSCOPY  MAY 2012 DYSPHAGIA   IR:[id number], ESO STR-SAV DIL 15 MM  . UPPER GASTROINTESTINAL ENDOSCOPY  MAY 2012   SAV DIL , next one w/ propofol     Family History  Problem Relation Age of Onset  . Lung cancer Mother        smoker  . Hypertension Mother   . Heart failure Father    . Colon polyps Neg Hx   . 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   Social History   Tobacco Use  . Smoking status: Never Smoker  . Smokeless tobacco: Never Used  Substance Use Topics  . Alcohol use: No    Current Meds  Medication Sig  . albuterol (VENTOLIN HFA) 108 (90 Base) MCG/ACT inhaler Inhale 2 puffs into the lungs every 4 (four) hours as needed for wheezing or shortness of breath.  . ALPRAZolam (XANAX) 1 MG tablet TAKE 1/2-1 TABLET BY MOUTH AT BEDTIME AS NEEDED FOR SLEEP.  Marland Kitchen mometasone (ELOCON) 0.1 % cream Apply qhs to irritated area for 5 days prn  . Probiotic Product (PROBIOTIC DAILY PO) Nature's Bounty  . traZODone (DESYREL) 50 MG tablet Take 1-2 tab p.o. qhs for insomnia.  Marland Kitchen triamterene-hydrochlorothiazide (MAXZIDE-25) 37.5-25 MG tablet Take 1 tablet by mouth daily. for blood pressure  . verapamil (VERELAN PM) 360 MG 24 hr capsule TAKE 1 CAPSULE DAILY FOR   BLOOD PRESSURE      Depression screen [facility] 2/9 01/22/2020 03/04/2016  Decreased Interest 0 0  Down, Depressed, Hopeless 0 0  PHQ - 2 Score 0 0  Altered sleeping 0 -  Tired, decreased energy 0 -  Change in appetite 0 -  Feeling bad or failure about yourself  0 -  Trouble concentrating 0 -  Moving slowly or fidgety/restless 0 -  Suicidal thoughts 0 -  PHQ-9 Score 0 -  Difficult doing work/chores Not difficult at all -     Objective:   Today's Vitals: BP 134/82   Pulse (!) 58   Temp (!) 97.2 F (36.2 C) (Temporal)   Ht 5\' 4"  (1.626 m)   SpO2 95%   BMI 25.23 kg/m  Vitals with BMI 01/22/2020 12/10/2019 10/01/2019  Height 5\' 4"  5\' 4"  5' 5.5"  Weight (No Data) 147 lbs 144 lbs  BMI - 25.22 23.59  Systolic 134 132 10/03/2019  Diastolic 82 80 80  Pulse 58 63 55     Physical Exam  She looks systemically well.  No new physical findings today.  She refused to be weighed  today.     Assessment   1. Age-related osteoporosis without current pathological fracture   2. Essential hypertension, benign   3. Primary ovarian failure   4. Dyslipidemia   5. Vitamin D deficiency disease       Tests ordered No orders of the defined types were placed in this encounter.    Plan: 1. I discussed all her results in detail. 2. I recommended vitamin D3 10,000 units daily for vitamin D deficiency. 3. She will continue with antihypertensive therapy as before. 4. We discussed her osteoporosis and the risk for fracture.  Obviously, she is aware of weightbearing exercises.  I also then discussed bioidentical hormone therapy to improve osteoporosis and we discussed women's health initiative study and the difference between synthetic and bioidentical hormones.  Also I discussed in some detail testosterone therapy.  She will think about all this and let me know if she would like to proceed with any of the hormones. 5. I also spent some time discussing nutrition and the concept of intermittent fasting combined with more of a plant-based diet.  I have given her diet sheet regarding this. 6. Follow-up in about 3 months to see how she is doing and we may well have to at that point check blood work and she will let me know if she wishes to proceed or not with bioidentical hormone therapy. 7. Today I spent approximately 40 minutes with this patient discussing in detail all her blood work and further recommendations above.   No orders of the defined types were placed in this encounter.   , MD

## 2020-01-31 ENCOUNTER — Other Ambulatory Visit: Payer: Self-pay | Admitting: Family Medicine

## 2020-02-23 ENCOUNTER — Encounter (INDEPENDENT_AMBULATORY_CARE_PROVIDER_SITE_OTHER): Payer: Self-pay | Admitting: Internal Medicine

## 2020-02-24 ENCOUNTER — Other Ambulatory Visit (INDEPENDENT_AMBULATORY_CARE_PROVIDER_SITE_OTHER): Payer: Self-pay | Admitting: Internal Medicine

## 2020-02-24 DIAGNOSIS — I10 Essential (primary) hypertension: Secondary | ICD-10-CM

## 2020-02-24 MED ORDER — TRIAMTERENE-HCTZ 37.5-25 MG PO TABS: 1.0000 | ORAL_TABLET | Freq: Every day | ORAL | 1 refills | Status: DC

## 2020-04-14 ENCOUNTER — Encounter (INDEPENDENT_AMBULATORY_CARE_PROVIDER_SITE_OTHER): Payer: Self-pay | Admitting: Internal Medicine

## 2020-04-14 ENCOUNTER — Other Ambulatory Visit: Payer: Self-pay

## 2020-04-14 ENCOUNTER — Ambulatory Visit (INDEPENDENT_AMBULATORY_CARE_PROVIDER_SITE_OTHER): Payer: BC Managed Care – PPO | Admitting: Internal Medicine

## 2020-04-14 VITALS — BP 132/84 | HR 54 | Temp 97.7°F | Ht 64.0 in | Wt 152.6 lb

## 2020-04-14 DIAGNOSIS — Z1159 Encounter for screening for other viral diseases: Secondary | ICD-10-CM

## 2020-04-14 DIAGNOSIS — M810 Age-related osteoporosis without current pathological fracture: Secondary | ICD-10-CM | POA: Diagnosis not present

## 2020-04-14 DIAGNOSIS — I10 Essential (primary) hypertension: Secondary | ICD-10-CM

## 2020-04-14 DIAGNOSIS — E559 Vitamin D deficiency, unspecified: Secondary | ICD-10-CM

## 2020-04-14 NOTE — Progress Notes (Signed)
Metrics: Intervention Frequency ACO  Documented Smoking Status Yearly  Screened one or more times in 24 months  Cessation Counseling or  Active cessation medication Past 24 months  Past 24 months   Guideline developer: UpToDate (See UpToDate for funding source) Date Released: 2014       Wellness Office Visit  Subjective:  Patient ID: [name], female    DOB: 1960-12-07  Age: 60 y.o. MRN: [id number]  CC: This lady comes in for follow-up of hypertension, osteoporosis, vitamin D deficiency. HPI  On the last visit, I recommended she start taking vitamin D3 10,000 units daily and she is now in fact taking vitamin D3 9000 units daily. We also discussed bioidentical hormone therapy and she is really not quite sure that she should proceed with this at the present time. She continues on Maxide and verapamil for hypertension. Past Medical History:  Diagnosis Date  . Allergic rhinitis   . Anxiety   . Asthma   . Chronic bronchitis   . Constipation   . Depression   . GERD (gastroesophageal reflux disease)   . Hypertension   . Migraines   . Plantar fasciitis   . PONV (postoperative nausea and vomiting)    Past Surgical History:  Procedure Laterality Date  . COLONOSCOPY  MAY 2012 ARS   SLF: NSAID COLON ULCERS, SML IH  . KNEE ARTHROSCOPY WITH MEDIAL MENISECTOMY Left 10/05/2012   Procedure: LEFT KNEE ARTHROSCOPY WITH MEDIAL MENISECTOMY;  Surgeon: Vickki Hearing, MD;  Location: [facility];  Service: Orthopedics;  Laterality: Left;  . MYRINGOTOMY    . NASAL SINUS SURGERY    . right foot     plantar fascitis  . TONSILLECTOMY    . UPPER GASTROINTESTINAL ENDOSCOPY  MAY 2012 DYSPHAGIA   QQ:[id number], ESO STR-SAV DIL 15 MM  . UPPER GASTROINTESTINAL ENDOSCOPY  MAY 2012   SAV DIL , next one w/ propofol     Family History  Problem Relation Age of Onset  . Lung cancer Mother        smoker  . Hypertension Mother   . Heart failure Father   . Colon polyps Neg Hx   .  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   Social History   Tobacco Use  . Smoking status: Never Smoker  . Smokeless tobacco: Never Used  Substance Use Topics  . Alcohol use: No    Current Meds  Medication Sig  . albuterol (VENTOLIN HFA) 108 (90 Base) MCG/ACT inhaler Inhale 2 puffs into the lungs every 4 (four) hours as needed for wheezing or shortness of breath.  . ALPRAZolam (XANAX) 1 MG tablet TAKE 1/2-1 TABLET BY MOUTH AT BEDTIME AS NEEDED FOR SLEEP.  Marland Kitchen Cholecalciferol (VITAMIN D3) 125 MCG/ML LIQD Take 9,000 Units by mouth daily at 12 noon.  . mometasone (ELOCON) 0.1 % cream Apply qhs to irritated area for 5 days prn  . Probiotic Product (PROBIOTIC DAILY PO) Nature's Bounty  . traZODone (DESYREL) 50 MG tablet Take 1-2 tab p.o. qhs for insomnia.  Marland Kitchen triamterene-hydrochlorothiazide (MAXZIDE-25) 37.5-25 MG tablet Take 1 tablet by mouth daily. for blood pressure  . verapamil (VERELAN PM) 360 MG 24 hr capsule TAKE 1 CAPSULE DAILY FOR   BLOOD PRESSURE     Flowsheet Row Office Visit from 04/14/2020 in [facility]  PHQ-9 Total Score 0  Objective:   Today's Vitals: BP 132/84   Pulse (!) 54   Temp 97.7 F (36.5 C) (Temporal)   Ht 5\' 4"  (1.626 m)   Wt 152 lb 9.6 oz (69.2 kg)   SpO2 95%   BMI 26.19 kg/m  Vitals with BMI 04/14/2020 01/22/2020 12/10/2019  Height 5\' 4"  5\' 4"  5\' 4"   Weight 152 lbs 10 oz (No Data) 147 lbs  BMI 26.18 - 25.22  Systolic 132 134 13/10/2019  Diastolic 84 82 80  Pulse 54 58 63     Physical Exam   She looks systemically well.  Blood pressure acceptable.  Alert and orientated without any focal neurological signs.    Assessment   1. Essential hypertension, benign   2. Age-related osteoporosis without current pathological fracture   3. Vitamin D deficiency disease   4. Encounter for hepatitis C screening test for  low risk patient       Tests ordered Orders Placed This Encounter  Procedures  . COMPLETE METABOLIC PANEL WITH GFR  . VITAMIN D 25 Hydroxy (Vit-D Deficiency, Fractures)  . Hepatitis C antibody     Plan: 1. She will continue with antihypertensive medication and her blood pressure is under reasonable control.  We will check renal function. 2. She will continue with vitamin D3 9000 units daily and I will check vitamin D levels. 3. We discussed osteoporosis again and she certainly does weightbearing exercises and exercises on a regular basis in the gym.  For the time being, we will not start bioidentical hormone therapy. 4. I discussed the importance of Pap smear and mammography and she will think about it.  It has been many years since she has had either of these done. 5. Follow-up with in about 4 months.   No orders of the defined types were placed in this encounter.   , MD

## 2020-04-15 LAB — COMPLETE METABOLIC PANEL WITH GFR
AG Ratio: 1.6 (calc) (ref 1.0–2.5)
ALT: 13 U/L (ref 6–29)
AST: 20 U/L (ref 10–35)
Albumin: 4.2 g/dL (ref 3.6–5.1)
Alkaline phosphatase (APISO): 71 U/L (ref 37–153)
BUN: 21 mg/dL (ref 7–25)
CO2: 29 mmol/L (ref 20–32)
Calcium: 9.9 mg/dL (ref 8.6–10.4)
Chloride: 98 mmol/L (ref 98–110)
Creat: 0.89 mg/dL (ref 0.50–0.99)
GFR, Est African American: 81 mL/min/{1.73_m2} (ref >=60)
GFR, Est Non African American: 70 mL/min/{1.73_m2} (ref >=60)
Globulin: 2.7 g/dL (calc) (ref 1.9–3.7)
Glucose, Bld: 90 mg/dL (ref 65–139)
Potassium: 5.2 mmol/L (ref 3.5–5.3)
Sodium: 135 mmol/L (ref 135–146)
Total Bilirubin: 0.5 mg/dL (ref 0.2–1.2)
Total Protein: 6.9 g/dL (ref 6.1–8.1)

## 2020-04-15 LAB — VITAMIN D 25 HYDROXY (VIT D DEFICIENCY, FRACTURES): Vit D, 25-Hydroxy: 76 ng/mL (ref 30–100)

## 2020-04-21 ENCOUNTER — Encounter: Payer: Self-pay | Admitting: Internal Medicine

## 2020-04-23 ENCOUNTER — Other Ambulatory Visit: Payer: Self-pay | Admitting: Family Medicine

## 2020-04-23 ENCOUNTER — Encounter (INDEPENDENT_AMBULATORY_CARE_PROVIDER_SITE_OTHER): Payer: Self-pay | Admitting: Internal Medicine

## 2020-04-23 ENCOUNTER — Other Ambulatory Visit (INDEPENDENT_AMBULATORY_CARE_PROVIDER_SITE_OTHER): Payer: Self-pay | Admitting: Internal Medicine

## 2020-04-23 MED ORDER — ESTRADIOL 0.5 MG PO TABS: 0.5000 mg | ORAL_TABLET | Freq: Every day | ORAL | 3 refills | Status: DC

## 2020-04-23 MED ORDER — PROGESTERONE MICRONIZED 100 MG PO CAPS
100.0000 mg | ORAL_CAPSULE | Freq: Every day | ORAL | 3 refills | Status: DC
Start: 2020-04-23 — End: 2020-08-24

## 2020-05-06 ENCOUNTER — Encounter (INDEPENDENT_AMBULATORY_CARE_PROVIDER_SITE_OTHER): Payer: Self-pay | Admitting: Internal Medicine

## 2020-05-06 ENCOUNTER — Other Ambulatory Visit (INDEPENDENT_AMBULATORY_CARE_PROVIDER_SITE_OTHER): Payer: Self-pay | Admitting: Internal Medicine

## 2020-05-06 MED ORDER — VERAPAMIL HCL ER 360 MG PO CP24: 360.0000 mg | ORAL_CAPSULE | Freq: Every day | ORAL | 0 refills | Status: DC

## 2020-08-07 ENCOUNTER — Other Ambulatory Visit (INDEPENDENT_AMBULATORY_CARE_PROVIDER_SITE_OTHER): Payer: Self-pay | Admitting: Internal Medicine

## 2020-08-20 ENCOUNTER — Ambulatory Visit (INDEPENDENT_AMBULATORY_CARE_PROVIDER_SITE_OTHER): Payer: BC Managed Care – PPO | Admitting: Nurse Practitioner

## 2020-08-24 ENCOUNTER — Other Ambulatory Visit: Payer: Self-pay

## 2020-08-24 ENCOUNTER — Ambulatory Visit (INDEPENDENT_AMBULATORY_CARE_PROVIDER_SITE_OTHER): Payer: BC Managed Care – PPO | Admitting: Internal Medicine

## 2020-08-24 ENCOUNTER — Encounter (INDEPENDENT_AMBULATORY_CARE_PROVIDER_SITE_OTHER): Payer: Self-pay | Admitting: Internal Medicine

## 2020-08-24 VITALS — BP 122/82 | HR 61 | Temp 97.5°F | Ht 64.0 in | Wt 147.6 lb

## 2020-08-24 DIAGNOSIS — M810 Age-related osteoporosis without current pathological fracture: Secondary | ICD-10-CM | POA: Diagnosis not present

## 2020-08-24 DIAGNOSIS — I10 Essential (primary) hypertension: Secondary | ICD-10-CM

## 2020-08-24 DIAGNOSIS — E559 Vitamin D deficiency, unspecified: Secondary | ICD-10-CM | POA: Diagnosis not present

## 2020-08-24 MED ORDER — ALPRAZOLAM 1 MG PO TABS: ORAL_TABLET | ORAL | 2 refills | Status: DC

## 2020-08-24 NOTE — Progress Notes (Signed)
Metrics: Intervention Frequency ACO  Documented Smoking Status Yearly  Screened one or more times in 24 months  Cessation Counseling or  Active cessation medication Past 24 months  Past 24 months   Guideline developer: UpToDate (See UpToDate for funding source) Date Released: 2014       Wellness Office Visit  Subjective:  Patient ID: [name], female    DOB: 02-27-1960  Age: 60 y.o. MRN: [id number]  CC: This lady comes in for follow-up of bioidentical hormone therapy, hypertension, osteoporosis.  She also has vitamin D deficiency and takes vitamin D3 supplementation. HPI  Since we started her on estradiol and progesterone, she has not tolerated it well especially the progesterone.  Progesterone apparently gave her really bad nightmares.  She does not take it every day either.  She has a uterus and this leaves estradiol unopposed. She continues on Xanax which does tend to help her sleep and she takes a small dose every night. She continues on Maxide and verapamil for hypertension. Past Medical History:  Diagnosis Date   Allergic rhinitis    Anxiety    Asthma    Chronic bronchitis    Constipation    Depression    GERD (gastroesophageal reflux disease)    Hypertension    Migraines    Plantar fasciitis    PONV (postoperative nausea and vomiting)    Past Surgical History:  Procedure Laterality Date   COLONOSCOPY  MAY 2012 ARS   SLF: NSAID COLON ULCERS, SML IH   KNEE ARTHROSCOPY WITH MEDIAL MENISECTOMY Left 10/05/2012   Procedure: LEFT KNEE ARTHROSCOPY WITH MEDIAL MENISECTOMY;  Surgeon: Vickki Hearing, MD;  Location: [facility];  Service: Orthopedics;  Laterality: Left;   MYRINGOTOMY     NASAL SINUS SURGERY     right foot     plantar fascitis   TONSILLECTOMY     UPPER GASTROINTESTINAL ENDOSCOPY  MAY 2012 DYSPHAGIA   [id number], ESO STR-SAV DIL 15 MM   UPPER GASTROINTESTINAL ENDOSCOPY  MAY 2012   SAV DIL , next one w/ propofol     Family History   Problem Relation Age of Onset   Lung cancer Mother        smoker   Hypertension Mother    Heart failure Father    Colon polyps Neg Hx    Crohn's disease Neg Hx     Social History   Social History Narrative   Single,lives alone.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   Social History   Tobacco Use   Smoking status: Never   Smokeless tobacco: Never  Substance Use Topics   Alcohol use: No    Current Meds  Medication Sig   albuterol (VENTOLIN HFA) 108 (90 Base) MCG/ACT inhaler Inhale 2 puffs into the lungs every 4 (four) hours as needed for wheezing or shortness of breath.   Cholecalciferol (VITAMIN D3) 125 MCG/ML LIQD Take 9,000 Units by mouth daily at 12 noon.   mometasone (ELOCON) 0.1 % cream Apply qhs to irritated area for 5 days prn   Probiotic Product (PROBIOTIC DAILY PO) Nature's Bounty   traZODone (DESYREL) 50 MG tablet Take 1-2 tab p.o. qhs for insomnia.   triamterene-hydrochlorothiazide (MAXZIDE-25) 37.5-25 MG tablet Take 1 tablet by mouth daily. for blood pressure   verapamil (VERELAN PM) 360 MG 24 hr capsule TAKE 1 CAPSULE AT BEDTIME   [DISCONTINUED] ALPRAZolam (XANAX) 1 MG tablet TAKE 1/2-1 TABLET BY MOUTH AT BEDTIME AS NEEDED FOR SLEEP.   [DISCONTINUED]  estradiol (ESTRACE) 0.5 MG tablet Take 1 tablet (0.5 mg total) by mouth daily.   [DISCONTINUED] progesterone (PROMETRIUM) 100 MG capsule Take 1 capsule (100 mg total) by mouth daily.     Flowsheet Row Office Visit from 08/24/2020 in [facility]  PHQ-9 Total Score 0       Objective:   Today's Vitals: BP 122/82   Pulse 61   Temp (!) 97.5 F (36.4 C) (Temporal)   Ht 5\' 4"  (1.626 m)   Wt 147 lb 9.6 oz (67 kg)   SpO2 96%   BMI 25.34 kg/m  Vitals with BMI 08/24/2020 04/14/2020 01/22/2020  Height 5\' 4"  5\' 4"  5\' 4"   Weight 147 lbs 10 oz 152 lbs 10 oz (No Data)  BMI 25.32 26.18 -  Systolic 122 132 01/24/2020  Diastolic 82 84 82  Pulse 61 54 58     Physical Exam  She  looks systemically well.  Her blood pressure is improved.  She has lost 5 pounds in weight.     Assessment   1. Essential hypertension, benign   2. Vitamin D deficiency disease   3. Age-related osteoporosis without current pathological fracture       Tests ordered No orders of the defined types were placed in this encounter.    Plan: 1.  I explained to her that she cannot take unopposed estradiol and therefore I recommended that she discontinue all bioidentical hormones for the time being.  She will do this. 2.  I have refilled her Xanax which will help her sleep.  She does not abuse it. 3.  Continue with antihypertensive therapy as before which seems to be helping her blood pressure and keeping it under good control. 4.  I will see her in 6 months for an annual physical exam.    Meds ordered this encounter  Medications   ALPRAZolam (XANAX) 1 MG tablet    Sig: TAKE 1/2-1 TABLET BY MOUTH AT BEDTIME AS NEEDED FOR SLEEP.    Dispense:  30 tablet    Refill:  2     Artist Bloom , MD

## 2020-09-08 ENCOUNTER — Telehealth (INDEPENDENT_AMBULATORY_CARE_PROVIDER_SITE_OTHER): Payer: Self-pay

## 2020-09-08 DIAGNOSIS — I10 Essential (primary) hypertension: Secondary | ICD-10-CM

## 2020-09-08 MED ORDER — VERAPAMIL HCL ER 360 MG PO CP24: 360.0000 mg | ORAL_CAPSULE | Freq: Every day | ORAL | 0 refills | Status: DC

## 2020-09-08 MED ORDER — TRIAMTERENE-HCTZ 37.5-25 MG PO TABS: 1.0000 | ORAL_TABLET | Freq: Every day | ORAL | 0 refills | Status: DC

## 2020-09-08 NOTE — Telephone Encounter (Signed)
I have approved the refills and sent them to [facility].

## 2020-09-08 NOTE — Telephone Encounter (Signed)
Patient called [name] requesting a refill on her medications since they are booked out for almost 2 months.  triamterene-hydrochlorothiazide (MAXZIDE-25) 37.5-25 MG tablet Last filled 02/24/2020, # 90 with 1 refill  verapamil (VERELAN PM) 360 MG 24 hr capsule  Last filled 08/08/2020, # 90 with 0 refills  Looks like she just got a refill of Alprazolam 1mg  with refills

## 2020-09-14 ENCOUNTER — Other Ambulatory Visit (INDEPENDENT_AMBULATORY_CARE_PROVIDER_SITE_OTHER): Payer: Self-pay

## 2020-09-14 DIAGNOSIS — I10 Essential (primary) hypertension: Secondary | ICD-10-CM

## 2020-11-05 ENCOUNTER — Other Ambulatory Visit (INDEPENDENT_AMBULATORY_CARE_PROVIDER_SITE_OTHER): Payer: Self-pay | Admitting: Nurse Practitioner

## 2020-11-05 DIAGNOSIS — I10 Essential (primary) hypertension: Secondary | ICD-10-CM

## 2020-12-02 ENCOUNTER — Encounter: Payer: Self-pay | Admitting: Internal Medicine

## 2020-12-02 ENCOUNTER — Other Ambulatory Visit: Payer: Self-pay

## 2020-12-02 ENCOUNTER — Other Ambulatory Visit: Payer: Self-pay | Admitting: Internal Medicine

## 2020-12-02 ENCOUNTER — Ambulatory Visit: Payer: BC Managed Care – PPO | Admitting: Internal Medicine

## 2020-12-02 VITALS — BP 159/74 | HR 53 | Temp 97.7°F | Resp 18 | Ht 64.0 in | Wt 149.0 lb

## 2020-12-02 DIAGNOSIS — M810 Age-related osteoporosis without current pathological fracture: Secondary | ICD-10-CM

## 2020-12-02 DIAGNOSIS — I10 Essential (primary) hypertension: Secondary | ICD-10-CM | POA: Diagnosis not present

## 2020-12-02 DIAGNOSIS — R7303 Prediabetes: Secondary | ICD-10-CM

## 2020-12-02 DIAGNOSIS — H903 Sensorineural hearing loss, bilateral: Secondary | ICD-10-CM

## 2020-12-02 DIAGNOSIS — K219 Gastro-esophageal reflux disease without esophagitis: Secondary | ICD-10-CM

## 2020-12-02 DIAGNOSIS — F411 Generalized anxiety disorder: Secondary | ICD-10-CM | POA: Diagnosis not present

## 2020-12-02 DIAGNOSIS — Z2821 Immunization not carried out because of patient refusal: Secondary | ICD-10-CM

## 2020-12-02 DIAGNOSIS — E782 Mixed hyperlipidemia: Secondary | ICD-10-CM

## 2020-12-02 DIAGNOSIS — Z1231 Encounter for screening mammogram for malignant neoplasm of breast: Secondary | ICD-10-CM

## 2020-12-02 MED ORDER — VERAPAMIL HCL ER 180 MG PO CP24: 360.0000 mg | ORAL_CAPSULE | Freq: Every day | ORAL | 1 refills | Status: DC

## 2020-12-02 MED ORDER — VERAPAMIL HCL ER 360 MG PO CP24: 360.0000 mg | ORAL_CAPSULE | Freq: Every day | ORAL | 0 refills | Status: DC

## 2020-12-02 MED ORDER — TRIAMTERENE-HCTZ 75-50 MG PO TABS: 1.0000 | ORAL_TABLET | Freq: Every day | ORAL | 0 refills | Status: DC

## 2020-12-02 MED ORDER — ALPRAZOLAM 1 MG PO TABS: ORAL_TABLET | ORAL | 2 refills | Status: DC

## 2020-12-02 NOTE — Addendum Note (Signed)
Addended byTrena Platt on: 12/02/2020 06:07 PM   Modules accepted: Orders

## 2020-12-02 NOTE — Assessment & Plan Note (Signed)
Avoid hot and spicy food Used to take PPI Has GI appointment

## 2020-12-02 NOTE — Progress Notes (Signed)
New Patient Office Visit  Subjective:  Patient ID: [name], female    DOB: 1960-07-15  Age: 60 y.o. MRN: [id number]  CC:  Chief Complaint  Patient presents with   New Patient (Initial Visit)    New patient was seeing gosrani bp has been high needs refills on meds has been checking bp at home     HPI [name] is a 60 year old female with PMH of HTN, GERD,  b/l hearing loss, osteoporosis and GAD who presents for establishing care.  HTN: Her BP was elevated in the office today.  She has brought home BP readings, which show BP between 140-170/70-90 at most of the time. She has been taking Maxzide and Verapamil regularly.  She has intermittent headache, which generalized.  Denies any chest pain, dyspnea or palpitations.  She takes Xanax 0.5 to 1 tablet at bedtime for anxiety/insomnia.  She requests a refill for it.  Denies any anhedonia, SI or HI.  She has a history of osteoporosis, but takes only vitamin D drops currently.  Does not want any treatment for now.  She has intermittent dyspnea upon exertion at times, which improves with Albuterol. She was told of having chronic bronchitis in the past, although she denies any smoking hx.  She follows up with ENT specialist and audiologist for b/l hearing loss, and has hearing aide.  She has had 3 doses of COVID vaccine. She refused flu vaccine today.  Past Medical History:  Diagnosis Date   Allergic rhinitis    Anxiety    Asthma    Chronic bronchitis    Constipation    Depression    GERD (gastroesophageal reflux disease)    History of eustachian tube dysfunction 09/14/2017   Hypertension    Migraines    Plantar fasciitis    PONV (postoperative nausea and vomiting)     Past Surgical History:  Procedure Laterality Date   COLONOSCOPY  MAY 2012 ARS   SLF: NSAID COLON ULCERS, SML IH   KNEE ARTHROSCOPY WITH MEDIAL MENISECTOMY Left 10/05/2012   Procedure: LEFT KNEE ARTHROSCOPY WITH MEDIAL MENISECTOMY;  Surgeon:  Carole Civil, MD;  Location: [facility];  Service: Orthopedics;  Laterality: Left;   MYRINGOTOMY     NASAL SINUS SURGERY     right foot     plantar fascitis   TONSILLECTOMY     UPPER GASTROINTESTINAL ENDOSCOPY  MAY 2012 DYSPHAGIA   QQ:[id number]/[id number], ESO STR-SAV DIL 15 MM   UPPER GASTROINTESTINAL ENDOSCOPY  MAY 2012   SAV DIL 16MM, next one w/ propofol    Family History  Problem Relation Age of Onset   Lung cancer Mother        smoker   Hypertension Mother    Heart failure Father    Colon polyps Neg Hx    Crohn's disease Neg Hx     Social History   Socioeconomic History   Marital status: Single    Spouse name: Not on file   Number of children: 0   Years of education: Not on file   Highest education level: Not on file  Occupational History   Occupation: Scientist, forensic    Comment: [employer]  Tobacco Use   Smoking status: Never   Smokeless tobacco: Never  Vaping Use   Vaping Use: Never used  Substance and Sexual Activity   Alcohol use: No   Drug use: No   Sexual activity: Never    Birth control/protection: Other-see comments  Other Topics Concern  Not on file  Social History Narrative   Single,lives alone.Part time transportation at [facility] in [place].   Social Determinants of Health   Financial Resource Strain: Not on file  Food Insecurity: Not on file  Transportation Needs: Not on file  Physical Activity: Not on file  Stress: Not on file  Social Connections: Not on file  Intimate Partner Violence: Not on file    ROS Review of Systems  Constitutional:  Negative for chills and fever.  HENT:  Negative for congestion, sinus pressure, sinus pain and sore throat.   Eyes:  Negative for pain and discharge.  Respiratory:  Negative for cough and shortness of breath.   Cardiovascular:  Negative for chest pain and palpitations.  Gastrointestinal:  Negative for abdominal pain, diarrhea, nausea and vomiting.   Endocrine: Negative for polydipsia and polyuria.  Genitourinary:  Negative for dysuria and hematuria.  Musculoskeletal:  Negative for neck pain and neck stiffness.  Skin:  Negative for rash.  Neurological:  Negative for dizziness and weakness.  Psychiatric/Behavioral:  Positive for sleep disturbance. Negative for agitation and behavioral problems. The patient is nervous/anxious.    Objective:   Today's Vitals: BP (!) 159/74 (BP Location: Left Arm, Patient Position: Sitting, Cuff Size: Normal)   Pulse (!) 53   Temp 97.7 F (36.5 C) (Oral)   Resp 18   Ht _0  (1.626 m)   Wt 149 lb 0.6 oz (67.6 kg)   SpO2 97%   BMI 25.58 kg/m   Physical Exam Vitals reviewed.  Constitutional:      General: She is not in acute distress.    Appearance: She is not diaphoretic.  HENT:     Head: Normocephalic and atraumatic.     Nose: Nose normal.     Mouth/Throat:     Mouth: Mucous membranes are moist.  Eyes:     General: No scleral icterus.    Extraocular Movements: Extraocular movements intact.  Cardiovascular:     Rate and Rhythm: Normal rate and regular rhythm.     Pulses: Normal pulses.     Heart sounds: Normal heart sounds. No murmur heard. Pulmonary:     Breath sounds: Normal breath sounds. No wheezing or rales.  Abdominal:     Palpations: Abdomen is soft.     Tenderness: There is no abdominal tenderness.  Musculoskeletal:     Cervical back: Neck supple. No tenderness.     Right lower leg: No edema.     Left lower leg: No edema.  Skin:    General: Skin is warm.     Findings: No rash.  Neurological:     General: No focal deficit present.     Mental Status: She is alert and oriented to person, place, and time.     Sensory: No sensory deficit.     Motor: No weakness.  Psychiatric:        Mood and Affect: Mood normal.        Behavior: Behavior normal.    Assessment & Plan:   Problem List Items Addressed This Visit       Cardiovascular and Mediastinum   Essential  hypertension, benign - Primary    BP Readings from Last 1 Encounters:  12/02/20 (!) 159/74  uncontrolled with Maxzide and Verapamil Increased dose of Maxzide to 75-50 mg QD Home BP readings reviewed Counseled for compliance with the medications Advised DASH diet and moderate exercise/walking, at least 150 mins/week       Relevant  Medications   triamterene-hydrochlorothiazide (MAXZIDE) 75-50 MG tablet   verapamil (VERELAN PM) 360 MG 24 hr capsule   Other Relevant Orders   CMP14+EGFR     Digestive   GERD (gastroesophageal reflux disease)    Avoid hot and spicy food Used to take PPI Has GI appointment        Nervous and Auditory   Sensorineural hearing loss (SNHL), bilateral    Followed by ENT and audiologist Wears hearing aide        Musculoskeletal and Integument   Osteoporosis without current pathological fracture    DEXA scan in 2021 showed osteoporosis Takes only Vit D supplement 6000 IU drops Does not want bisphosphonate or any treatment for osteoporosis for now Advised to take Caltrate 600+ D3 BID        Other   GAD (generalized anxiety disorder)    Well-controlled currently Takes Xanax 0.5-1 tablet qHS - refilled after reviewing PDMP      Relevant Medications   ALPRAZolam (XANAX) 1 MG tablet   Other Visit Diagnoses     Refused influenza vaccine       Prediabetes       Relevant Orders   CMP14+EGFR   Hemoglobin A1c   Mixed hyperlipidemia       Relevant Medications   triamterene-hydrochlorothiazide (MAXZIDE) 75-50 MG tablet   verapamil (VERELAN PM) 360 MG 24 hr capsule   Other Relevant Orders   Lipid panel   Screening mammogram for breast cancer       Relevant Orders   MM DIAG BREAST TOMO BILATERAL       Outpatient Encounter Medications as of 12/02/2020  Medication Sig   albuterol (VENTOLIN HFA) 108 (90 Base) MCG/ACT inhaler Inhale 2 puffs into the lungs every 4 (four) hours as needed for wheezing or shortness of breath.   Cholecalciferol  (VITAMIN D3) 125 MCG/ML LIQD Take 9,000 Units by mouth daily at 12 noon.   mometasone (ELOCON) 0.1 % cream Apply qhs to irritated area for 5 days prn   Probiotic Product (PROBIOTIC DAILY PO) Nature's Bounty   triamterene-hydrochlorothiazide (MAXZIDE) 75-50 MG tablet Take 1 tablet by mouth daily.   [DISCONTINUED] ALPRAZolam (XANAX) 1 MG tablet TAKE 1/2-1 TABLET BY MOUTH AT BEDTIME AS NEEDED FOR SLEEP.   [DISCONTINUED] triamterene-hydrochlorothiazide (MAXZIDE-25) 37.5-25 MG tablet Take 1 tablet by mouth daily. for blood pressure   [DISCONTINUED] verapamil (VERELAN PM) 360 MG 24 hr capsule Take 1 capsule (360 mg total) by mouth at bedtime.   ALPRAZolam (XANAX) 1 MG tablet TAKE 1/2-1 TABLET BY MOUTH AT BEDTIME AS NEEDED FOR SLEEP.   verapamil (VERELAN PM) 360 MG 24 hr capsule Take 1 capsule (360 mg total) by mouth at bedtime.   [DISCONTINUED] traZODone (DESYREL) 50 MG tablet Take 1-2 tab p.o. qhs for insomnia.   No facility-administered encounter medications on file as of 12/02/2020.    Follow-up: Return in about 6 weeks (around 01/13/2021) for HTN.   Lindell Spar, MD

## 2020-12-02 NOTE — Assessment & Plan Note (Signed)
DEXA scan in 2021 showed osteoporosis Takes only Vit D supplement 6000 IU drops Does not want bisphosphonate or any treatment for osteoporosis for now Advised to take Caltrate 600+ D3 BID

## 2020-12-02 NOTE — Assessment & Plan Note (Signed)
Well-controlled currently Takes Xanax 0.5-1 tablet qHS - refilled after reviewing PDMP

## 2020-12-02 NOTE — Assessment & Plan Note (Signed)
Followed by ENT and audiologist Wears hearing aide

## 2020-12-02 NOTE — Assessment & Plan Note (Signed)
BP Readings from Last 1 Encounters:  12/02/20 (!) 159/74   uncontrolled with Maxzide and Verapamil Increased dose of Maxzide to 75-50 mg QD Home BP readings reviewed Counseled for compliance with the medications Advised DASH diet and moderate exercise/walking, at least 150 mins/week

## 2020-12-02 NOTE — Patient Instructions (Signed)
Please start taking Maxzide 75-50 mg once daily.  Please start taking Caltrate 600 plus D3 twice daily.  Continue other medications as prescribed.

## 2020-12-03 ENCOUNTER — Encounter: Payer: Self-pay | Admitting: *Deleted

## 2020-12-03 ENCOUNTER — Ambulatory Visit: Payer: BC Managed Care – PPO | Admitting: Internal Medicine

## 2020-12-03 ENCOUNTER — Encounter: Payer: Self-pay | Admitting: Internal Medicine

## 2020-12-03 VITALS — BP 153/80 | HR 52 | Temp 97.5°F | Ht 64.0 in | Wt 154.6 lb

## 2020-12-03 DIAGNOSIS — K219 Gastro-esophageal reflux disease without esophagitis: Secondary | ICD-10-CM | POA: Diagnosis not present

## 2020-12-03 DIAGNOSIS — K5900 Constipation, unspecified: Secondary | ICD-10-CM

## 2020-12-03 DIAGNOSIS — Z1211 Encounter for screening for malignant neoplasm of colon: Secondary | ICD-10-CM | POA: Diagnosis not present

## 2020-12-03 LAB — CMP14+EGFR
ALT: 14 IU/L (ref 0–32)
AST: 24 IU/L (ref 0–40)
Albumin/Globulin Ratio: 1.8 (ref 1.2–2.2)
Albumin: 4.7 g/dL (ref 3.8–4.8)
Alkaline Phosphatase: 90 IU/L (ref 44–121)
BUN/Creatinine Ratio: 16 (ref 12–28)
BUN: 17 mg/dL (ref 8–27)
Bilirubin Total: 0.6 mg/dL (ref 0.0–1.2)
CO2: 25 mmol/L (ref 20–29)
Calcium: 10.3 mg/dL (ref 8.7–10.3)
Chloride: 99 mmol/L (ref 96–106)
Creatinine, Ser: 1.05 mg/dL — ABNORMAL HIGH (ref 0.57–1.00)
Globulin, Total: 2.6 g/dL (ref 1.5–4.5)
Glucose: 90 mg/dL (ref 70–99)
Potassium: 5 mmol/L (ref 3.5–5.2)
Sodium: 138 mmol/L (ref 134–144)
Total Protein: 7.3 g/dL (ref 6.0–8.5)
eGFR: 60 mL/min/{1.73_m2} (ref >59)

## 2020-12-03 LAB — LIPID PANEL
Chol/HDL Ratio: 2.2 ratio (ref 0.0–4.4)
Cholesterol, Total: 256 mg/dL — ABNORMAL HIGH (ref 100–199)
HDL: 119 mg/dL (ref >39)
LDL Chol Calc (NIH): 127 mg/dL — ABNORMAL HIGH (ref 0–99)
Triglycerides: 62 mg/dL (ref 0–149)
VLDL Cholesterol Cal: 10 mg/dL (ref 5–40)

## 2020-12-03 LAB — HEMOGLOBIN A1C
Est. average glucose Bld gHb Est-mCnc: 111 mg/dL
Hgb A1c MFr Bld: 5.5 % (ref 4.8–5.6)

## 2020-12-03 MED ORDER — PEG 3350-KCL-NA BICARB-NACL 420 G PO SOLR: ORAL | 0 refills | Status: DC

## 2020-12-03 NOTE — H&P (View-Only) (Signed)
Primary Care Physician:  Anabel Halon, MD Primary Gastroenterologist:  Dr. Marletta Lor  Chief complaint: Due for screening colonoscopy, GERD  HPI:   [name] is a 60 y.o. female who presents to clinic today to discuss screening colonoscopy.  Patient last seen in our office in 2014.  Last colonoscopy 2012 unremarkable besides 2 small ulcers, biopsies negative for any chronic changes.  She was having GERD and dysphagia at the time which showed H. pylori negative gastritis.  Previously on omeprazole for GERD but now controlling her symptoms with lifestyle modification.  Denies any dysphagia odynophagia.  No epigastric or chest pain.  No melena hematochezia.  No unintentional weight loss.  No family history of colorectal malignancy.  Does note some mild intermittent constipation for which she takes over-the-counter Ex-Lax on occasion if she gets backed up.  No abdominal pain.  Past Medical History:  Diagnosis Date   Allergic rhinitis    Anxiety    Asthma    Chronic bronchitis    Constipation    Depression    GERD (gastroesophageal reflux disease)    History of eustachian tube dysfunction 09/14/2017   Hypertension    Migraines    Plantar fasciitis    PONV (postoperative nausea and vomiting)     Past Surgical History:  Procedure Laterality Date   COLONOSCOPY  MAY 2012 ARS   SLF: NSAID COLON ULCERS, SML IH   KNEE ARTHROSCOPY WITH MEDIAL MENISECTOMY Left 10/05/2012   Procedure: LEFT KNEE ARTHROSCOPY WITH MEDIAL MENISECTOMY;  Surgeon: Vickki Hearing, MD;  Location: [facility];  Service: Orthopedics;  Laterality: Left;   MYRINGOTOMY     NASAL SINUS SURGERY     right foot     plantar fascitis   TONSILLECTOMY     UPPER GASTROINTESTINAL ENDOSCOPY  MAY 2012 DYSPHAGIA   MG:[id number], ESO STR-SAV DIL 15 MM   UPPER GASTROINTESTINAL ENDOSCOPY  MAY 2012   SAV DIL , next one w/ propofol    Current Outpatient Medications  Medication Sig Dispense Refill   albuterol  (VENTOLIN HFA) 108 (90 Base) MCG/ACT inhaler Inhale 2 puffs into the lungs every 4 (four) hours as needed for wheezing or shortness of breath. 18 g 1   ALPRAZolam (XANAX) 1 MG tablet TAKE 1/2-1 TABLET BY MOUTH AT BEDTIME AS NEEDED FOR SLEEP. 30 tablet 2   Cholecalciferol (VITAMIN D3) 125 MCG/ML LIQD Take 9,000 Units by mouth daily at 12 noon.     mometasone (ELOCON) 0.1 % cream Apply qhs to irritated area for 5 days prn 15 g 0   triamterene-hydrochlorothiazide (MAXZIDE) 75-50 MG tablet Take 1 tablet by mouth daily. 90 tablet 0   Probiotic Product (PROBIOTIC DAILY PO) Nature's Bounty (Patient not taking: Reported on 12/03/2020)     verapamil (VERELAN PM) 180 MG 24 hr capsule Take 2 capsules (360 mg total) by mouth at bedtime. 180 capsule 1   No current facility-administered medications for this visit.    Allergies as of 12/03/2020 - Review Complete 12/03/2020  Allergen Reaction Noted   Levaquin [levofloxacin in d5w] Nausea Only 08/07/2012    Family History  Problem Relation Age of Onset   Lung cancer Mother        smoker   Hypertension Mother    Heart failure Father    Colon polyps Neg Hx    Crohn's disease Neg Hx     Social History   Socioeconomic History   Marital status: Single    Spouse name: Not on file  Number of children: 0   Years of education: Not on file   Highest education level: Not on file  Occupational History   Occupation: Scientist, forensic    Comment: [employer]  Tobacco Use   Smoking status: Never   Smokeless tobacco: Never  Vaping Use   Vaping Use: Never used  Substance and Sexual Activity   Alcohol use: No   Drug use: No   Sexual activity: Never    Birth control/protection: Other-see comments  Other Topics Concern   Not on file  Social History Narrative   Single,lives alone.Part time transportation at [facility] in [place].   Social Determinants of Health   Financial Resource Strain: Not on file  Food  Insecurity: Not on file  Transportation Needs: Not on file  Physical Activity: Not on file  Stress: Not on file  Social Connections: Not on file  Intimate Partner Violence: Not on file    Subjective: Review of Systems  Constitutional:  Negative for chills and fever.  HENT:  Negative for congestion and hearing loss.   Eyes:  Negative for blurred vision and double vision.  Respiratory:  Negative for cough and shortness of breath.   Cardiovascular:  Negative for chest pain and palpitations.  Gastrointestinal:  Negative for abdominal pain, blood in stool, constipation, diarrhea, heartburn, melena and vomiting.  Genitourinary:  Negative for dysuria and urgency.  Musculoskeletal:  Negative for joint pain and myalgias.  Skin:  Negative for itching and rash.  Neurological:  Negative for dizziness and headaches.  Psychiatric/Behavioral:  Negative for depression. The patient is not nervous/anxious.       Objective: BP (!) 153/80   Pulse (!) 52   Temp (!) 97.5 F (36.4 C) (Temporal)   Ht 5\' 4"  (1.626 m)   Wt 154 lb 9.6 oz (70.1 kg)   BMI 26.54 kg/m  Physical Exam Constitutional:      Appearance: Normal appearance.  HENT:     Head: Normocephalic and atraumatic.  Eyes:     Extraocular Movements: Extraocular movements intact.     Conjunctiva/sclera: Conjunctivae normal.  Cardiovascular:     Rate and Rhythm: Normal rate and regular rhythm.  Pulmonary:     Effort: Pulmonary effort is normal.     Breath sounds: Normal breath sounds.  Abdominal:     General: Bowel sounds are normal.     Palpations: Abdomen is soft.  Musculoskeletal:        General: No swelling. Normal range of motion.     Cervical back: Normal range of motion and neck supple.  Skin:    General: Skin is warm and dry.     Coloration: Skin is not jaundiced.  Neurological:     General: No focal deficit present.     Mental Status: She is alert and oriented to person, place, and time.  Psychiatric:        Mood and  Affect: Mood normal.        Behavior: Behavior normal.     Assessment: *GERD-mild *Constipation-mild, intermittent  *Colon cancer screening  Plan: GERD well controlled with lifestyle changes, no alarm symptoms today to warrant endoscopic evaluation  Will schedule for screening colonoscopy.The risks including infection, bleed, or perforation as well as benefits, limitations, alternatives and imponderables have been reviewed with the patient. Questions have been answered. All parties agreeable.  Constipation is mild intermittent.  Recommended that she start taking MiraLAX 1 capful daily.  Can increase to 2 capfuls daily as  needed.  Continue to implement fiber in her diet.   12/03/2020 9:07 AM   Disclaimer: This note was dictated with voice recognition software. Similar sounding words can inadvertently be transcribed and may not be corrected upon review.

## 2020-12-03 NOTE — Progress Notes (Signed)
Primary Care Physician:  Anabel Halon, MD Primary Gastroenterologist:  Dr. Marletta Lor  Chief complaint: Due for screening colonoscopy, GERD  HPI:   [name] is a 60 y.o. female who presents to clinic today to discuss screening colonoscopy.  Patient last seen in our office in 2014.  Last colonoscopy 2012 unremarkable besides 2 small ulcers, biopsies negative for any chronic changes.  She was having GERD and dysphagia at the time which showed H. pylori negative gastritis.  Previously on omeprazole for GERD but now controlling her symptoms with lifestyle modification.  Denies any dysphagia odynophagia.  No epigastric or chest pain.  No melena hematochezia.  No unintentional weight loss.  No family history of colorectal malignancy.  Does note some mild intermittent constipation for which she takes over-the-counter Ex-Lax on occasion if she gets backed up.  No abdominal pain.  Past Medical History:  Diagnosis Date   Allergic rhinitis    Anxiety    Asthma    Chronic bronchitis    Constipation    Depression    GERD (gastroesophageal reflux disease)    History of eustachian tube dysfunction 09/14/2017   Hypertension    Migraines    Plantar fasciitis    PONV (postoperative nausea and vomiting)     Past Surgical History:  Procedure Laterality Date   COLONOSCOPY  MAY 2012 ARS   SLF: NSAID COLON ULCERS, SML IH   KNEE ARTHROSCOPY WITH MEDIAL MENISECTOMY Left 10/05/2012   Procedure: LEFT KNEE ARTHROSCOPY WITH MEDIAL MENISECTOMY;  Surgeon: Vickki Hearing, MD;  Location: [facility];  Service: Orthopedics;  Laterality: Left;   MYRINGOTOMY     NASAL SINUS SURGERY     right foot     plantar fascitis   TONSILLECTOMY     UPPER GASTROINTESTINAL ENDOSCOPY  MAY 2012 DYSPHAGIA   MG:[id number]/TOIZTIWPYK, ESO STR-SAV DIL 15 MM   UPPER GASTROINTESTINAL ENDOSCOPY  MAY 2012   SAV DIL , next one w/ propofol    Current Outpatient Medications  Medication Sig Dispense Refill   albuterol  (VENTOLIN HFA) 108 (90 Base) MCG/ACT inhaler Inhale 2 puffs into the lungs every 4 (four) hours as needed for wheezing or shortness of breath. 18 g 1   ALPRAZolam (XANAX) 1 MG tablet TAKE 1/2-1 TABLET BY MOUTH AT BEDTIME AS NEEDED FOR SLEEP. 30 tablet 2   Cholecalciferol (VITAMIN D3) 125 MCG/ML LIQD Take 9,000 Units by mouth daily at 12 noon.     mometasone (ELOCON) 0.1 % cream Apply qhs to irritated area for 5 days prn 15 g 0   triamterene-hydrochlorothiazide (MAXZIDE) 75-50 MG tablet Take 1 tablet by mouth daily. 90 tablet 0   Probiotic Product (PROBIOTIC DAILY PO) Nature's Bounty (Patient not taking: Reported on 12/03/2020)     verapamil (VERELAN PM) 180 MG 24 hr capsule Take 2 capsules (360 mg total) by mouth at bedtime. 180 capsule 1   No current facility-administered medications for this visit.    Allergies as of 12/03/2020 - Review Complete 12/03/2020  Allergen Reaction Noted   Levaquin [levofloxacin in d5w] Nausea Only 08/07/2012    Family History  Problem Relation Age of Onset   Lung cancer Mother        smoker   Hypertension Mother    Heart failure Father    Colon polyps Neg Hx    Crohn's disease Neg Hx     Social History   Socioeconomic History   Marital status: Single    Spouse name: Not on file  Number of children: 0   Years of education: Not on file   Highest education level: Not on file  Occupational History   Occupation: Scientist, forensic    Comment: [employer]  Tobacco Use   Smoking status: Never   Smokeless tobacco: Never  Vaping Use   Vaping Use: Never used  Substance and Sexual Activity   Alcohol use: No   Drug use: No   Sexual activity: Never    Birth control/protection: Other-see comments  Other Topics Concern   Not on file  Social History Narrative   Single,lives alone.Part time transportation at [facility] in [place].   Social Determinants of Health   Financial Resource Strain: Not on file  Food  Insecurity: Not on file  Transportation Needs: Not on file  Physical Activity: Not on file  Stress: Not on file  Social Connections: Not on file  Intimate Partner Violence: Not on file    Subjective: Review of Systems  Constitutional:  Negative for chills and fever.  HENT:  Negative for congestion and hearing loss.   Eyes:  Negative for blurred vision and double vision.  Respiratory:  Negative for cough and shortness of breath.   Cardiovascular:  Negative for chest pain and palpitations.  Gastrointestinal:  Negative for abdominal pain, blood in stool, constipation, diarrhea, heartburn, melena and vomiting.  Genitourinary:  Negative for dysuria and urgency.  Musculoskeletal:  Negative for joint pain and myalgias.  Skin:  Negative for itching and rash.  Neurological:  Negative for dizziness and headaches.  Psychiatric/Behavioral:  Negative for depression. The patient is not nervous/anxious.       Objective: BP (!) 153/80   Pulse (!) 52   Temp (!) 97.5 F (36.4 C) (Temporal)   Ht 5\' 4"  (1.626 m)   Wt 154 lb 9.6 oz (70.1 kg)   BMI 26.54 kg/m  Physical Exam Constitutional:      Appearance: Normal appearance.  HENT:     Head: Normocephalic and atraumatic.  Eyes:     Extraocular Movements: Extraocular movements intact.     Conjunctiva/sclera: Conjunctivae normal.  Cardiovascular:     Rate and Rhythm: Normal rate and regular rhythm.  Pulmonary:     Effort: Pulmonary effort is normal.     Breath sounds: Normal breath sounds.  Abdominal:     General: Bowel sounds are normal.     Palpations: Abdomen is soft.  Musculoskeletal:        General: No swelling. Normal range of motion.     Cervical back: Normal range of motion and neck supple.  Skin:    General: Skin is warm and dry.     Coloration: Skin is not jaundiced.  Neurological:     General: No focal deficit present.     Mental Status: She is alert and oriented to person, place, and time.  Psychiatric:        Mood and  Affect: Mood normal.        Behavior: Behavior normal.     Assessment: *GERD-mild *Constipation-mild, intermittent  *Colon cancer screening  Plan: GERD well controlled with lifestyle changes, no alarm symptoms today to warrant endoscopic evaluation  Will schedule for screening colonoscopy.The risks including infection, bleed, or perforation as well as benefits, limitations, alternatives and imponderables have been reviewed with the patient. Questions have been answered. All parties agreeable.  Constipation is mild intermittent.  Recommended that she start taking MiraLAX 1 capful daily.  Can increase to 2 capfuls daily as  needed.  Continue to implement fiber in her diet.   12/03/2020 9:07 AM   Disclaimer: This note was dictated with voice recognition software. Similar sounding words can inadvertently be transcribed and may not be corrected upon review.

## 2020-12-03 NOTE — Patient Instructions (Signed)
We will schedule you for colonoscopy for colon cancer screening purposes.  I will evaluate you for hemorrhoids at the same time.  I am happy to hear that your reflux is relatively well controlled with lifestyle modification.  If this worsens, then please let us know.  Continue on as needed over-the-counter medications for your constipation.  I tend to recommend MiraLAX 1 capful daily for mild constipation.  You can increase to twice daily if needed.  However if Ex-Lax is working for you then that is also good.  It was very nice meeting you today.  Dr. Marletta Lor  At [facility] we value your feedback. You may receive a survey about your visit today. Please share your experience as we strive to create trusting relationships with our patients to provide genuine, compassionate, quality care.  We appreciate your understanding and patience as we review any laboratory studies, imaging, and other diagnostic tests that are ordered as we care for you. Our office policy is 5 business days for review of these results, and any emergent or urgent results are addressed in a timely manner for your best interest. If you do not hear from our office in 1 week, please contact us.   We also encourage the use of MyChart, which contains your medical information for your review as well. If you are not enrolled in this feature, an access code is on this after visit summary for your convenience. Thank you for allowing [place] to be involved in your care.  It was great to see you today!  I hope you have a great rest of your Fall!    Hennie Duos. Marletta Lor, D.O. Gastroenterology and Hepatology [facility]

## 2020-12-03 NOTE — Addendum Note (Signed)
Addended by: Armstead Peaks on: 12/03/2020 09:30 AM   Modules accepted: Orders

## 2020-12-04 ENCOUNTER — Telehealth: Payer: Self-pay | Admitting: Internal Medicine

## 2020-12-04 NOTE — Telephone Encounter (Signed)
Pharmacy [phone] Order number [id number]  They have questions concerning medication

## 2020-12-05 ENCOUNTER — Other Ambulatory Visit: Payer: Self-pay | Admitting: Internal Medicine

## 2020-12-05 DIAGNOSIS — Z1231 Encounter for screening mammogram for malignant neoplasm of breast: Secondary | ICD-10-CM

## 2020-12-07 NOTE — Telephone Encounter (Signed)
[facility] notified verapamil 360 changed to 180 per provider

## 2020-12-09 NOTE — Patient Instructions (Signed)
[name]  12/09/2020     @PREFPERIOPPHARMACY @   Your procedure is scheduled on  12/14/2020.   Report to 12/16/2020 at  [phone]  A.M.   Call this number if you have problems the morning of surgery:  [phone]   Remember:  Follow the diet and prep instructions given to you by the office.          Use your inhaler before you come and bring your rescue inhaler with you.    Take these medicines the morning of surgery with A SIP OF WATER                                       None        Do not wear jewelry, make-up or nail polish.  Do not wear lotions, powders, or perfumes, or deodorant.  Do not shave 48 hours prior to surgery.  Men may shave face and neck.  Do not bring valuables to the hospital.  [facility] is not responsible for any belongings or valuables.  Contacts, dentures or bridgework may not be worn into surgery.  Leave your suitcase in the car.  After surgery it may be brought to your room.  For patients admitted to the hospital, discharge time will be determined by your treatment team.  Patients discharged the day of surgery will not be allowed to drive home and must have someone with them for 24 hours.    Special instructions:   DO NOT smoke tobacco or vape for 24 hours before your procedure.  Please read over the following fact sheets that you were given. Anesthesia Post-op Instructions and Care and Recovery After Surgery      Colonoscopy, Adult, Care After This sheet gives you information about how to care for yourself after your procedure. Your health care provider may also give you more specific instructions. If you have problems or questions, contact your health care provider. What can I expect after the procedure? After the procedure, it is common to have: A small amount of blood in your stool for 24 hours after the procedure. Some gas. Mild cramping or bloating of your abdomen. Follow these instructions at home: Eating and  drinking  Drink enough fluid to keep your urine pale yellow. Follow instructions from your health care provider about eating or drinking restrictions. Resume your normal diet as instructed by your health care provider. Avoid heavy or fried foods that are hard to digest. Activity Rest as told by your health care provider. Avoid sitting for a long time without moving. Get up to take short walks every 1-2 hours. This is important to improve blood flow and breathing. Ask for help if you feel weak or unsteady. Return to your normal activities as told by your health care provider. Ask your health care provider what activities are safe for you. Managing cramping and bloating  Try walking around when you have cramps or feel bloated. Apply heat to your abdomen as told by your health care provider. Use the heat source that your health care provider recommends, such as a moist heat pack or a heating pad. Place a towel between your skin and the heat source. Leave the heat on for 20-30 minutes. Remove the heat if your skin turns bright red. This is especially important if you are unable to feel pain, heat, or cold. You  may have a greater risk of getting burned. General instructions If you were given a sedative during the procedure, it can affect you for several hours. Do not drive or operate machinery until your health care provider says that it is safe. For the first 24 hours after the procedure: Do not sign important documents. Do not drink alcohol. Do your regular daily activities at a slower pace than normal. Eat soft foods that are easy to digest. Take over-the-counter and prescription medicines only as told by your health care provider. Keep all follow-up visits as told by your health care provider. This is important. Contact a health care provider if: You have blood in your stool 2-3 days after the procedure. Get help right away if you have: More than a small spotting of blood in your  stool. Large blood clots in your stool. Swelling of your abdomen. Nausea or vomiting. A fever. Increasing pain in your abdomen that is not relieved with medicine. Summary After the procedure, it is common to have a small amount of blood in your stool. You may also have mild cramping and bloating of your abdomen. If you were given a sedative during the procedure, it can affect you for several hours. Do not drive or operate machinery until your health care provider says that it is safe. Get help right away if you have a lot of blood in your stool, nausea or vomiting, a fever, or increased pain in your abdomen. This information is not intended to replace advice given to you by your health care provider. Make sure you discuss any questions you have with your health care provider. Document Revised: 11/23/2018 Document Reviewed: 08/13/2018 Elsevier Patient Education  2022 [employer] Monitored Anesthesia Care, Care After This sheet gives you information about how to care for yourself after your procedure. Your health care provider may also give you more specific instructions. If you have problems or questions, contact your health care provider. What can I expect after the procedure? After the procedure, it is common to have: Tiredness. Forgetfulness about what happened after the procedure. Impaired judgment for important decisions. Nausea or vomiting. Some difficulty with balance. Follow these instructions at home: For the time period you were told by your health care provider:   Rest as needed. Do not participate in activities where you could fall or become injured. Do not drive or use machinery. Do not drink alcohol. Do not take sleeping pills or medicines that cause drowsiness. Do not make important decisions or sign legal documents. Do not take care of children on your own. Eating and drinking Follow the diet that is recommended by your health care provider. Drink enough fluid to  keep your urine pale yellow. If you vomit: Drink water, juice, or soup when you can drink without vomiting. Make sure you have little or no nausea before eating solid foods. General instructions Have a responsible adult stay with you for the time you are told. It is important to have someone help care for you until you are awake and alert. Take over-the-counter and prescription medicines only as told by your health care provider. If you have sleep apnea, surgery and certain medicines can increase your risk for breathing problems. Follow instructions from your health care provider about wearing your sleep device: Anytime you are sleeping, including during daytime naps. While taking prescription pain medicines, sleeping medicines, or medicines that make you drowsy. Avoid smoking. Keep all follow-up visits as told by your health care provider. This is important.  Contact a health care provider if: You keep feeling nauseous or you keep vomiting. You feel light-headed. You are still sleepy or having trouble with balance after 24 hours. You develop a rash. You have a fever. You have redness or swelling around the IV site. Get help right away if: You have trouble breathing. You have new-onset confusion at home. Summary For several hours after your procedure, you may feel tired. You may also be forgetful and have poor judgment. Have a responsible adult stay with you for the time you are told. It is important to have someone help care for you until you are awake and alert. Rest as told. Do not drive or operate machinery. Do not drink alcohol or take sleeping pills. Get help right away if you have trouble breathing, or if you suddenly become confused. This information is not intended to replace advice given to you by your health care provider. Make sure you discuss any questions you have with your health care provider. Document Revised: 10/03/2019 Document Reviewed: 12/20/2018 Elsevier Patient  Education  2022 [employer].

## 2020-12-11 ENCOUNTER — Other Ambulatory Visit: Payer: Self-pay

## 2020-12-11 ENCOUNTER — Encounter (HOSPITAL_COMMUNITY)
Admission: RE | Admit: 2020-12-11 | Discharge: 2020-12-11 | Disposition: A | Discharge status: Home / Self Care | Payer: BC Managed Care – PPO | Source: Ambulatory Visit | Attending: Internal Medicine | Admitting: Internal Medicine

## 2020-12-11 ENCOUNTER — Encounter (HOSPITAL_COMMUNITY): Payer: Self-pay

## 2020-12-11 DIAGNOSIS — Z1211 Encounter for screening for malignant neoplasm of colon: Secondary | ICD-10-CM | POA: Diagnosis present

## 2020-12-11 DIAGNOSIS — K5730 Diverticulosis of large intestine without perforation or abscess without bleeding: Secondary | ICD-10-CM | POA: Diagnosis not present

## 2020-12-11 DIAGNOSIS — K648 Other hemorrhoids: Secondary | ICD-10-CM | POA: Diagnosis not present

## 2020-12-11 DIAGNOSIS — K219 Gastro-esophageal reflux disease without esophagitis: Secondary | ICD-10-CM | POA: Diagnosis not present

## 2020-12-11 DIAGNOSIS — K5900 Constipation, unspecified: Secondary | ICD-10-CM | POA: Diagnosis not present

## 2020-12-11 DIAGNOSIS — Z01810 Encounter for preprocedural cardiovascular examination: Secondary | ICD-10-CM | POA: Insufficient documentation

## 2020-12-14 ENCOUNTER — Encounter (HOSPITAL_COMMUNITY)
Admission: RE | Disposition: A | Discharge status: Home / Self Care | Payer: Self-pay | Source: Home / Self Care | Attending: Internal Medicine

## 2020-12-14 ENCOUNTER — Ambulatory Visit (HOSPITAL_COMMUNITY)
Admission: RE | Admit: 2020-12-14 | Discharge: 2020-12-14 | Disposition: A | Discharge status: Home / Self Care | Payer: BC Managed Care – PPO | Attending: Internal Medicine | Admitting: Internal Medicine

## 2020-12-14 ENCOUNTER — Ambulatory Visit (HOSPITAL_COMMUNITY): Payer: BC Managed Care – PPO | Admitting: Anesthesiology

## 2020-12-14 ENCOUNTER — Encounter (HOSPITAL_COMMUNITY): Payer: Self-pay

## 2020-12-14 DIAGNOSIS — Z1211 Encounter for screening for malignant neoplasm of colon: Secondary | ICD-10-CM

## 2020-12-14 DIAGNOSIS — K5900 Constipation, unspecified: Secondary | ICD-10-CM | POA: Insufficient documentation

## 2020-12-14 DIAGNOSIS — K219 Gastro-esophageal reflux disease without esophagitis: Secondary | ICD-10-CM | POA: Insufficient documentation

## 2020-12-14 DIAGNOSIS — K648 Other hemorrhoids: Secondary | ICD-10-CM | POA: Insufficient documentation

## 2020-12-14 DIAGNOSIS — K5730 Diverticulosis of large intestine without perforation or abscess without bleeding: Secondary | ICD-10-CM | POA: Insufficient documentation

## 2020-12-14 HISTORY — PX: COLONOSCOPY WITH PROPOFOL: SHX5780

## 2020-12-14 SURGERY — COLONOSCOPY WITH PROPOFOL
Anesthesia: General

## 2020-12-14 MED ORDER — PROPOFOL 10 MG/ML IV BOLUS
INTRAVENOUS | Status: DC | PRN
  Administered 2020-12-14: 50 mg via INTRAVENOUS
  Administered 2020-12-14: 80 mg via INTRAVENOUS
  Administered 2020-12-14: 50 mg via INTRAVENOUS
  Administered 2020-12-14: 120 mg via INTRAVENOUS
  Administered 2020-12-14 (×2): 50 mg via INTRAVENOUS

## 2020-12-14 MED ORDER — LACTATED RINGERS IV SOLN: INTRAVENOUS | Status: DC | PRN

## 2020-12-14 MED ORDER — LACTATED RINGERS IV SOLN: INTRAVENOUS | Status: DC

## 2020-12-14 MED ORDER — LIDOCAINE HCL (CARDIAC) PF 100 MG/5ML IV SOSY
PREFILLED_SYRINGE | INTRAVENOUS | Status: DC | PRN
  Administered 2020-12-14: 50 mg via INTRAVENOUS

## 2020-12-14 NOTE — Anesthesia Procedure Notes (Signed)
Date/Time: 12/14/2020 8:14 AM Performed by: Julian Reil, CRNA Pre-anesthesia Checklist: Patient identified, Emergency Drugs available, Suction available and Patient being monitored Patient Re-evaluated:Patient Re-evaluated prior to induction Oxygen Delivery Method: Nasal cannula Induction Type: IV induction Placement Confirmation: positive ETCO2

## 2020-12-14 NOTE — Interval H&P Note (Signed)
History and Physical Interval Note:  12/14/2020 7:44 AM  [name]  has presented today for surgery, with the diagnosis of colon cancer screening.  The various methods of treatment have been discussed with the patient and family. After consideration of risks, benefits and other options for treatment, the patient has consented to  Procedure(s) with comments: COLONOSCOPY WITH PROPOFOL (N/A) - 7:30am as a surgical intervention.  The patient's history has been reviewed, patient examined, no change in status, stable for surgery.  I have reviewed the patient's chart and labs.  Questions were answered to the patient's satisfaction.     Lanelle Bal

## 2020-12-14 NOTE — Anesthesia Preprocedure Evaluation (Signed)
Anesthesia Evaluation  Patient identified by MRN, date of birth, ID band Patient awake    Reviewed: Allergy & Precautions, H&P , NPO status , Patient's Chart, lab work & pertinent test results, reviewed documented beta blocker date and time   History of Anesthesia Complications (+) PONV and history of anesthetic complications  Airway Mallampati: II  TM Distance: >3 FB Neck ROM: full    Dental no notable dental hx.    Pulmonary asthma ,    Pulmonary exam normal breath sounds clear to auscultation       Cardiovascular Exercise Tolerance: Good hypertension, negative cardio ROS   Rhythm:regular Rate:Normal     Neuro/Psych  Headaches, PSYCHIATRIC DISORDERS Anxiety Depression    GI/Hepatic Neg liver ROS, GERD  Medicated,  Endo/Other  negative endocrine ROS  Renal/GU negative Renal ROS  negative genitourinary   Musculoskeletal   Abdominal   Peds  Hematology negative hematology ROS (+)   Anesthesia Other Findings   Reproductive/Obstetrics negative OB ROS                             Anesthesia Physical Anesthesia Plan  ASA: 2  Anesthesia Plan: General   Post-op Pain Management:    Induction:   PONV Risk Score and Plan: Propofol infusion  Airway Management Planned:   Additional Equipment:   Intra-op Plan:   Post-operative Plan:   Informed Consent: I have reviewed the patients History and Physical, chart, labs and discussed the procedure including the risks, benefits and alternatives for the proposed anesthesia with the patient or authorized representative who has indicated his/her understanding and acceptance.     Dental Advisory Given  Plan Discussed with: CRNA  Anesthesia Plan Comments:         Anesthesia Quick Evaluation

## 2020-12-14 NOTE — Discharge Instructions (Addendum)

## 2020-12-14 NOTE — Op Note (Signed)
[facility] Patient Name: [name] Procedure Date: 12/14/2020 7:58 AM MRN: [id number] Date of Birth: [birth year] Attending MD: Elon Alas. Edgar Frisk CSN: [id number] Age: 60 Admit Type: Outpatient Procedure:                Colonoscopy Indications:              Screening for colorectal malignant neoplasm Providers:                Elon Alas. Abbey Chatters, DO, Lambert Mody, Raphael Gibney, Technician Referring MD:              Medicines:                See the Anesthesia note for documentation of the                            administered medications Complications:            No immediate complications. Estimated Blood Loss:     Estimated blood loss: none. Procedure:                Pre-Anesthesia Assessment:                           - The anesthesia plan was to use monitored                            anesthesia care (MAC).                           After obtaining informed consent, the colonoscope                            was passed under direct vision. Throughout the                            procedure, the patient's blood pressure, pulse, and                            oxygen saturations were monitored continuously. The                            [id number]-HQ190L ([id number]) scope was introduced through                            the anus and advanced to the the cecum, identified                            by appendiceal orifice and ileocecal valve. The                            colonoscopy was performed without difficulty. The                            patient tolerated the procedure well. The quality  of the bowel preparation was evaluated using the                            BBPS [facility] Bowel Preparation Scale) with scores                            of: Right Colon = 2 (minor amount of residual                            staining, small fragments of stool and/or opaque                            liquid, but mucosa seen  well), Transverse Colon = 2                            (minor amount of residual staining, small fragments                            of stool and/or opaque liquid, but mucosa seen                            well) and Left Colon = 2 (minor amount of residual                            staining, small fragments of stool and/or opaque                            liquid, but mucosa seen well). The total BBPS score                            equals 6. The quality of the bowel preparation was                            fair. Scope In: 8:11:30 AM Scope Out: 8:34:00 AM Scope Withdrawal Time: 0 hours 17 minutes 38 seconds  Total Procedure Duration: 0 hours 22 minutes 30 seconds  Findings:      The perianal and digital rectal examinations were normal.      Non-bleeding internal hemorrhoids were found during endoscopy.      Many small and large-mouthed diverticula were found in the sigmoid colon       and descending colon.      A moderate amount of stool was found in the entire colon, making       visualization difficult. Lavage of the area was performed using copious       amounts of sterile water, resulting in clearance with fair visualization. Impression:               - Preparation of the colon was fair.                           - Non-bleeding internal hemorrhoids.                           - Diverticulosis in the sigmoid  colon and in the                            descending colon.                           - Stool in the entire examined colon.                           - No specimens collected. Moderate Sedation:      Per Anesthesia Care Recommendation:           - Patient has a contact number available for                            emergencies. The signs and symptoms of potential                            delayed complications were discussed with the                            patient. Return to normal activities tomorrow.                            Written discharge instructions were  provided to the                            patient.                           - Resume previous diet.                           - Continue present medications.                           - Repeat colonoscopy in 10 years for screening                            purposes.                           - Return to GI clinic PRN. Procedure Code(s):        --- Professional ---                           X9024, Colorectal cancer screening; colonoscopy on                            individual not meeting criteria for high risk Diagnosis Code(s):        --- Professional ---                           Z12.11, Encounter for screening for malignant                            neoplasm of colon  K64.8, Other hemorrhoids                           K57.30, Diverticulosis of large intestine without                            perforation or abscess without bleeding CPT copyright 2019 American Medical Association. All rights reserved. The codes documented in this report are preliminary and upon coder review may  be revised to meet current compliance requirements. Elon Alas. Abbey Chatters, DO [place] Abbey Chatters, DO 12/14/2020 8:40:01 AM This report has been signed electronically. Number of Addenda: 0

## 2020-12-14 NOTE — Transfer of Care (Signed)
Immediate Anesthesia Transfer of Care Note  Patient: [name]  Procedure(s) Performed: COLONOSCOPY WITH PROPOFOL  Patient Location: Short Stay  Anesthesia Type:General  Level of Consciousness: awake  Airway & Oxygen Therapy: Patient Spontanous Breathing  Post-op Assessment: Report given to RN and Post -op Vital signs reviewed and stable  Post vital signs: Reviewed and stable  Last Vitals:  Vitals Value Taken Time  BP    Temp    Pulse    Resp    SpO2      Last Pain:  Vitals:   12/14/20 0807  TempSrc:   PainSc: 0-No pain      Patients Stated Pain Goal: 8 (12/14/20 0710)  Complications: No notable events documented.

## 2020-12-14 NOTE — Anesthesia Postprocedure Evaluation (Signed)
Anesthesia Post Note  Patient: [name]  Procedure(s) Performed: COLONOSCOPY WITH PROPOFOL  Patient location during evaluation: Phase II Anesthesia Type: General Level of consciousness: awake Pain management: pain level controlled Vital Signs Assessment: post-procedure vital signs reviewed and stable Respiratory status: spontaneous breathing and respiratory function stable Cardiovascular status: blood pressure returned to baseline and stable Postop Assessment: no headache and no apparent nausea or vomiting Anesthetic complications: no Comments: Late entry   No notable events documented.   Last Vitals:  Vitals:   12/14/20 0710 12/14/20 0837  BP: 140/67 121/63  Pulse: (!) 59   Resp: 14   Temp: 36.7 C 36.6 C  SpO2: 97% 100%    Last Pain:  Vitals:   12/14/20 0837  TempSrc: Oral  PainSc: 0-No pain                 Windell Norfolk

## 2020-12-17 ENCOUNTER — Encounter (HOSPITAL_COMMUNITY): Payer: Self-pay | Admitting: Internal Medicine

## 2020-12-23 ENCOUNTER — Other Ambulatory Visit: Payer: Self-pay | Admitting: *Deleted

## 2020-12-23 ENCOUNTER — Telehealth: Payer: Self-pay | Admitting: Internal Medicine

## 2020-12-23 DIAGNOSIS — I10 Essential (primary) hypertension: Secondary | ICD-10-CM

## 2020-12-23 MED ORDER — TRIAMTERENE-HCTZ 75-50 MG PO TABS: 1.0000 | ORAL_TABLET | Freq: Every day | ORAL | 0 refills | Status: DC

## 2020-12-23 NOTE — Telephone Encounter (Signed)
LVM to let pt know meds were sent to [facility]

## 2020-12-23 NOTE — Telephone Encounter (Signed)
Pt called in about BP med  MAXZIDE   Pt is out of town for Thanksgiving and left BP meds at home    Pt states there is a nearby  pharm and wants to know if she can get a prescription for about 6 pills sent in to take until return home   Can call pt and discuss  [phone] Can Leave detailed message if pt does not answer may not have service   Pharm info  Closes at 4pm today  [facility]  [address] , [place], [place]  Phone : [phone]  Fax : [phone]

## 2020-12-23 NOTE — Telephone Encounter (Signed)
15 day supply sent to pharmacy please let her know

## 2021-01-06 ENCOUNTER — Ambulatory Visit: Payer: BC Managed Care – PPO | Admitting: Internal Medicine

## 2021-02-05 ENCOUNTER — Ambulatory Visit: Payer: BC Managed Care – PPO | Admitting: Internal Medicine

## 2021-02-05 ENCOUNTER — Encounter: Payer: Self-pay | Admitting: Internal Medicine

## 2021-02-05 ENCOUNTER — Other Ambulatory Visit: Payer: Self-pay

## 2021-02-05 VITALS — BP 124/66 | HR 55 | Ht 67.0 in | Wt 153.0 lb

## 2021-02-05 DIAGNOSIS — F411 Generalized anxiety disorder: Secondary | ICD-10-CM | POA: Diagnosis not present

## 2021-02-05 DIAGNOSIS — I10 Essential (primary) hypertension: Secondary | ICD-10-CM

## 2021-02-05 DIAGNOSIS — Z1159 Encounter for screening for other viral diseases: Secondary | ICD-10-CM

## 2021-02-05 MED ORDER — TRIAMTERENE-HCTZ 75-50 MG PO TABS: 1.0000 | ORAL_TABLET | Freq: Every day | ORAL | 3 refills | Status: DC

## 2021-02-05 MED ORDER — ALPRAZOLAM 1 MG PO TABS: ORAL_TABLET | ORAL | 2 refills | Status: DC

## 2021-02-05 NOTE — Assessment & Plan Note (Addendum)
BP Readings from Last 1 Encounters:  02/05/21 124/66   Well-controlled with Maxzide and Verapamil now Check BMP as Maxzide dose was increased in the last visit Counseled for compliance with the medications Advised DASH diet and moderate exercise/walking, at least 150 mins/week

## 2021-02-05 NOTE — Assessment & Plan Note (Signed)
Well-controlled currently Takes Xanax 0.5-1 tablet qHS - refilled after reviewing PDMP 

## 2021-02-05 NOTE — Progress Notes (Signed)
Established Patient Office Visit  Subjective:  Patient ID: [name], female    DOB: 20-Sep-1960  Age: 61 y.o. MRN: [id number]  CC:  Chief Complaint  Patient presents with   Follow-up   Hypertension    Htn follow up    HPI [name] is a 61 y.o. female with past medical history of HTN, GERD,  b/l hearing loss, osteoporosis and GAD who presents for f/u of her HTN and anxiety.  HTN: BP is well-controlled now with increased dose of Maxzide. Takes medications regularly. Patient denies headache, dizziness, chest pain, dyspnea or palpitations.  She takes Xanax 0.5 to 1 tablet at bedtime for anxiety/insomnia.  She requests a refill for it.  Denies any anhedonia, SI or HI.    Past Medical History:  Diagnosis Date   Allergic rhinitis    Anxiety    Asthma    Chronic bronchitis    Constipation    Depression    GERD (gastroesophageal reflux disease)    History of eustachian tube dysfunction 09/14/2017   Hypertension    Migraines    Plantar fasciitis    PONV (postoperative nausea and vomiting)     Past Surgical History:  Procedure Laterality Date   COLONOSCOPY  MAY 2012 ARS   SLF: NSAID COLON ULCERS, SML IH   COLONOSCOPY WITH PROPOFOL N/A 12/14/2020   Procedure: COLONOSCOPY WITH PROPOFOL;  Surgeon: Eloise Harman, DO;  Location: [facility];  Service: Endoscopy;  Laterality: N/A;  7:30am   KNEE ARTHROSCOPY WITH MEDIAL MENISECTOMY Left 10/05/2012   Procedure: LEFT KNEE ARTHROSCOPY WITH MEDIAL MENISECTOMY;  Surgeon: Carole Civil, MD;  Location: [facility];  Service: Orthopedics;  Laterality: Left;   MYRINGOTOMY     NASAL SINUS SURGERY     right foot     plantar fascitis   TONSILLECTOMY     UPPER GASTROINTESTINAL ENDOSCOPY  MAY 2012 DYSPHAGIA   [id number], ESO STR-SAV DIL 15 MM   UPPER GASTROINTESTINAL ENDOSCOPY  MAY 2012   SAV DIL 16MM, next one w/ propofol    Family History  Problem Relation Age of Onset   Lung cancer Mother         smoker   Hypertension Mother    Heart failure Father    Colon polyps Neg Hx    Crohn's disease Neg Hx     Social History   Socioeconomic History   Marital status: Single    Spouse name: Not on file   Number of children: 0   Years of education: Not on file   Highest education level: Not on file  Occupational History   Occupation: Scientist, forensic    Comment: [employer]  Tobacco Use   Smoking status: Never   Smokeless tobacco: Never  Vaping Use   Vaping Use: Never used  Substance and Sexual Activity   Alcohol use: No   Drug use: No   Sexual activity: Never    Birth control/protection: Other-see comments  Other Topics Concern   Not on file  Social History Narrative   Single,lives alone.Part time transportation at [facility] in [place].   Social Determinants of Health   Financial Resource Strain: Not on file  Food Insecurity: Not on file  Transportation Needs: Not on file  Physical Activity: Not on file  Stress: Not on file  Social Connections: Not on file  Intimate Partner Violence: Not on file    Outpatient Medications Prior to Visit  Medication Sig Dispense  Refill   albuterol (VENTOLIN HFA) 108 (90 Base) MCG/ACT inhaler Inhale 2 puffs into the lungs every 4 (four) hours as needed for wheezing or shortness of breath. 18 g 1   Cholecalciferol (VITAMIN D3) 125 MCG/ML LIQD Take 5,000 Units by mouth every 3 (three) days.     mometasone (ELOCON) 0.1 % cream Apply qhs to irritated area for 5 days prn (Patient taking differently: Apply 1 application topically daily as needed (scalp irritation).) 15 g 0   polyethylene glycol-electrolytes (NULYTELY) 420 g solution As directed 4000 mL 0   Probiotic Product (PROBIOTIC DAILY PO) Take 1 capsule by mouth daily. Nature's Bounty     verapamil (VERELAN PM) 180 MG 24 hr capsule Take 2 capsules (360 mg total) by mouth at bedtime. 180 capsule 1   ALPRAZolam (XANAX) 1 MG tablet TAKE 1/2-1 TABLET BY  MOUTH AT BEDTIME AS NEEDED FOR SLEEP. 30 tablet 2   triamterene-hydrochlorothiazide (MAXZIDE) 75-50 MG tablet Take 1 tablet by mouth daily. 15 tablet 0   No facility-administered medications prior to visit.    Allergies  Allergen Reactions   Levaquin [Levofloxacin In D5w] Nausea Only    ROS Review of Systems  Constitutional:  Negative for chills and fever.  HENT:  Negative for congestion, sinus pressure, sinus pain and sore throat.   Eyes:  Negative for pain and discharge.  Respiratory:  Negative for cough and shortness of breath.   Cardiovascular:  Negative for chest pain and palpitations.  Gastrointestinal:  Negative for abdominal pain, diarrhea, nausea and vomiting.  Endocrine: Negative for polydipsia and polyuria.  Genitourinary:  Negative for dysuria and hematuria.  Musculoskeletal:  Negative for neck pain and neck stiffness.  Skin:  Negative for rash.  Neurological:  Negative for dizziness and weakness.  Psychiatric/Behavioral:  Positive for sleep disturbance. Negative for agitation and behavioral problems. The patient is nervous/anxious.      Objective:    Physical Exam Vitals reviewed.  Constitutional:      General: She is not in acute distress.    Appearance: She is not diaphoretic.  HENT:     Head: Normocephalic and atraumatic.     Nose: Nose normal.     Mouth/Throat:     Mouth: Mucous membranes are moist.  Eyes:     General: No scleral icterus.    Extraocular Movements: Extraocular movements intact.  Cardiovascular:     Rate and Rhythm: Normal rate and regular rhythm.     Pulses: Normal pulses.     Heart sounds: Normal heart sounds. No murmur heard. Pulmonary:     Breath sounds: Normal breath sounds. No wheezing or rales.  Abdominal:     Palpations: Abdomen is soft.     Tenderness: There is no abdominal tenderness.  Musculoskeletal:     Cervical back: Neck supple. No tenderness.     Right lower leg: No edema.     Left lower leg: No edema.  Skin:     General: Skin is warm.     Findings: No rash.  Neurological:     General: No focal deficit present.     Mental Status: She is alert and oriented to person, place, and time.     Sensory: No sensory deficit.     Motor: No weakness.  Psychiatric:        Mood and Affect: Mood normal.        Behavior: Behavior normal.    BP 124/66    Pulse (!) 55    Ht 5'  7" (1.702 m)    Wt 153 lb (69.4 kg)    SpO2 96%    BMI 23.96 kg/m  Wt Readings from Last 3 Encounters:  02/05/21 153 lb (69.4 kg)  12/14/20 147 lb (66.7 kg)  12/11/20 154 lb 9.6 oz (70.1 kg)    Lab Results  Component Value Date   TSH 1.38 12/10/2019   Lab Results  Component Value Date   WBC 8.4 12/10/2019   HGB 14.4 12/10/2019   HCT 42.3 12/10/2019   MCV 89.4 12/10/2019   PLT 258 12/10/2019   Lab Results  Component Value Date   NA 138 12/02/2020   K 5.0 12/02/2020   CO2 25 12/02/2020   GLUCOSE 90 12/02/2020   BUN 17 12/02/2020   CREATININE 1.05 (H) 12/02/2020   BILITOT 0.6 12/02/2020   ALKPHOS 90 12/02/2020   AST 24 12/02/2020   ALT 14 12/02/2020   PROT 7.3 12/02/2020   ALBUMIN 4.7 12/02/2020   CALCIUM 10.3 12/02/2020   EGFR 60 12/02/2020   Lab Results  Component Value Date   CHOL 256 (H) 12/02/2020   Lab Results  Component Value Date   HDL 119 12/02/2020   Lab Results  Component Value Date   LDLCALC 127 (H) 12/02/2020   Lab Results  Component Value Date   TRIG 62 12/02/2020   Lab Results  Component Value Date   CHOLHDL 2.2 12/02/2020   Lab Results  Component Value Date   HGBA1C 5.5 12/02/2020      Assessment & Plan:   Problem List Items Addressed This Visit       Cardiovascular and Mediastinum   Essential hypertension, benign - Primary    BP Readings from Last 1 Encounters:  02/05/21 124/66  Well-controlled with Maxzide and Verapamil now Check BMP as Maxzide dose was increased in the last visit Counseled for compliance with the medications Advised DASH diet and moderate  exercise/walking, at least 150 mins/week      Relevant Medications   triamterene-hydrochlorothiazide (MAXZIDE) 75-50 MG tablet   Other Relevant Orders   Basic Metabolic Panel (BMET)     Other   GAD (generalized anxiety disorder)    Well-controlled currently Takes Xanax 0.5-1 tablet qHS - refilled after reviewing PDMP      Relevant Medications   ALPRAZolam (XANAX) 1 MG tablet (Start on 03/04/2021)   Other Visit Diagnoses     Need for hepatitis C screening test       Relevant Orders   Hepatitis C Antibody       Meds ordered this encounter  Medications   triamterene-hydrochlorothiazide (MAXZIDE) 75-50 MG tablet    Sig: Take 1 tablet by mouth daily.    Dispense:  90 tablet    Refill:  3   ALPRAZolam (XANAX) 1 MG tablet    Sig: TAKE 1/2-1 TABLET BY MOUTH AT BEDTIME AS NEEDED FOR SLEEP.    Dispense:  30 tablet    Refill:  2    Follow-up: Return in about 4 months (around 06/05/2021) for HTN and anxiety.    Lindell Spar, MD

## 2021-02-05 NOTE — Patient Instructions (Signed)
Please continue taking medications as prescribed.  Please continue to follow low salt diet and perform moderate exercise/walking at least 150 mins/week.  Please get fasting blood tests done before the next visit. 

## 2021-02-09 LAB — BASIC METABOLIC PANEL
BUN/Creatinine Ratio: 16 (ref 12–28)
BUN: 16 mg/dL (ref 8–27)
CO2: 27 mmol/L (ref 20–29)
Calcium: 10.1 mg/dL (ref 8.7–10.3)
Chloride: 100 mmol/L (ref 96–106)
Creatinine, Ser: 1 mg/dL (ref 0.57–1.00)
Glucose: 83 mg/dL (ref 70–99)
Potassium: 4.2 mmol/L (ref 3.5–5.2)
Sodium: 141 mmol/L (ref 134–144)
eGFR: 64 mL/min/{1.73_m2} (ref >59)

## 2021-02-09 LAB — HEPATITIS C ANTIBODY: Hep C Virus Ab: 0.1 s/co ratio (ref 0.0–0.9)

## 2021-02-10 ENCOUNTER — Telehealth: Payer: Self-pay | Admitting: Internal Medicine

## 2021-02-10 NOTE — Telephone Encounter (Signed)
Pt advised of labs with verbal understanding  

## 2021-02-10 NOTE — Telephone Encounter (Signed)
Pt return call °

## 2021-02-12 ENCOUNTER — Other Ambulatory Visit: Payer: Self-pay

## 2021-02-12 ENCOUNTER — Ambulatory Visit
Admission: RE | Admit: 2021-02-12 | Discharge: 2021-02-12 | Disposition: A | Discharge status: Home / Self Care | Payer: BC Managed Care – PPO | Source: Ambulatory Visit | Attending: Internal Medicine | Admitting: Internal Medicine

## 2021-02-12 DIAGNOSIS — Z1231 Encounter for screening mammogram for malignant neoplasm of breast: Secondary | ICD-10-CM

## 2021-02-27 ENCOUNTER — Encounter: Payer: Self-pay | Admitting: Internal Medicine

## 2021-03-01 ENCOUNTER — Other Ambulatory Visit: Payer: Self-pay | Admitting: Internal Medicine

## 2021-03-01 ENCOUNTER — Encounter (INDEPENDENT_AMBULATORY_CARE_PROVIDER_SITE_OTHER): Payer: BC Managed Care – PPO | Admitting: Internal Medicine

## 2021-03-01 DIAGNOSIS — R0609 Other forms of dyspnea: Secondary | ICD-10-CM

## 2021-03-02 LAB — CBC WITH DIFFERENTIAL/PLATELET
Basophils Absolute: 0.1 10*3/uL (ref 0.0–0.2)
Basos: 1 %
EOS (ABSOLUTE): 0.1 10*3/uL (ref 0.0–0.4)
Eos: 2 %
Hematocrit: 38 % (ref 34.0–46.6)
Hemoglobin: 12.9 g/dL (ref 11.1–15.9)
Immature Grans (Abs): 0.1 10*3/uL (ref 0.0–0.1)
Immature Granulocytes: 1 %
Lymphocytes Absolute: 2 10*3/uL (ref 0.7–3.1)
Lymphs: 30 %
MCH: 29.9 pg (ref 26.6–33.0)
MCHC: 33.9 g/dL (ref 31.5–35.7)
MCV: 88 fL (ref 79–97)
Monocytes Absolute: 0.3 10*3/uL (ref 0.1–0.9)
Monocytes: 5 %
Neutrophils Absolute: 4.3 10*3/uL (ref 1.4–7.0)
Neutrophils: 61 %
Platelets: 278 10*3/uL (ref 150–450)
RBC: 4.31 x10E6/uL (ref 3.77–5.28)
RDW: 12.6 % (ref 11.7–15.4)
WBC: 6.9 10*3/uL (ref 3.4–10.8)

## 2021-03-03 ENCOUNTER — Other Ambulatory Visit: Payer: Self-pay | Admitting: *Deleted

## 2021-03-03 DIAGNOSIS — R0609 Other forms of dyspnea: Secondary | ICD-10-CM

## 2021-04-19 ENCOUNTER — Other Ambulatory Visit: Payer: Self-pay

## 2021-04-19 ENCOUNTER — Ambulatory Visit (HOSPITAL_COMMUNITY)
Admission: RE | Admit: 2021-04-19 | Discharge: 2021-04-19 | Disposition: A | Discharge status: Home / Self Care | Payer: BC Managed Care – PPO | Source: Ambulatory Visit | Attending: Internal Medicine | Admitting: Internal Medicine

## 2021-04-19 ENCOUNTER — Ambulatory Visit: Payer: BC Managed Care – PPO | Admitting: Internal Medicine

## 2021-04-19 ENCOUNTER — Encounter: Payer: Self-pay | Admitting: Internal Medicine

## 2021-04-19 DIAGNOSIS — R0609 Other forms of dyspnea: Secondary | ICD-10-CM | POA: Insufficient documentation

## 2021-04-19 MED ORDER — BREZTRI AEROSPHERE 160-9-4.8 MCG/ACT IN AERO: 2.0000 | INHALATION_SPRAY | Freq: Two times a day (BID) | RESPIRATORY_TRACT | 0 refills | Status: DC

## 2021-04-19 MED ORDER — BUDESONIDE-FORMOTEROL FUMARATE 80-4.5 MCG/ACT IN AERO: INHALATION_SPRAY | RESPIRATORY_TRACT | 12 refills | Status: DC

## 2021-04-19 NOTE — Progress Notes (Signed)
? ?[name], female    DOB: 06-17-1960,   MRN: [id number] ? ? ?Brief patient profile:  ?63  yowf never smoker with childhood allergies  upper / lower= eval by [facility] allergist  on shots x years stopped in 20s x for once a year short of breath/fever/"pleurisy" all that stopped around around 2010 attributed to exercise but albuterol with hiking and with damp no more than once a day referred to pulmonary clinic in [place]  04/19/2021 by Dr [name]  for new pattern of daily doe  and also happens at rest . ? ? ? ? ?History of Present Illness  ?04/19/2021  Pulmonary/ 1st office eval/ [name] / [facility]  ?Chief Complaint  ?Patient presents with  ? Consult  ?  Patient has had changes in breathing. Increased DOE. Used inhaler and was dizzy after using.   ?Dyspnea:  at rest and on treadmill at 4 mph flat does ok then  6 mph immediately heart pounding and sob ?Cough: "always had it" pattern is  sporadic worse with damp weather /albuterol helps cough up mucus white ?Sleep: flat one pillow /no am congestion or noct wheeze/sob ?SABA use:  no more than twice daily and seems to help ? ?No obvious other patterns in day to day or daytime variability or assoc  purulent sputum or mucus plugs or hemoptysis or cp or chest tightness, subjective wheeze or overt sinus or hb symptoms.  ? ?Sleeping  without nocturnal  or early am exacerbation  of respiratory  c/o's or need for noct saba. Also denies any obvious fluctuation of symptoms with weather or environmental changes or other aggravating or alleviating factors except as outlined above  ? ?No unusual exposure hx or h/o childhood pna/ asthma or knowledge of premature birth. ? ?Current Allergies, Complete Past Medical History, Past Surgical History, Family History, and Social History were reviewed in [employer] record. ? ?ROS  The following are not active complaints unless bolded ?Hoarseness, sore throat, dysphagia, dental problems, itching, sneezing,  nasal  congestion or discharge of excess mucus or purulent secretions, ear ache,   fever, chills, sweats, unintended wt loss or wt gain, classically pleuritic or exertional cp,  orthopnea pnd or arm/hand swelling  or leg swelling, presyncope, palpitations, abdominal pain, anorexia, nausea, vomiting, diarrhea  or change in bowel habits or change in bladder habits, change in stools or change in urine, dysuria, hematuria,  rash, arthralgias, visual complaints, headache, numbness, weakness or ataxia or problems with walking or coordination,  change in mood or  memory. ?      ?   ? ? ? ? ?Past Medical History:  ?Diagnosis Date  ? Allergic rhinitis   ? Anxiety   ? Asthma   ? Chronic bronchitis   ? Constipation   ? Depression   ? GERD (gastroesophageal reflux disease)   ? History of eustachian tube dysfunction 09/14/2017  ? Hypertension   ? Migraines   ? Plantar fasciitis   ? PONV (postoperative nausea and vomiting)   ? ? ?Outpatient Medications Prior to Visit  ?Medication Sig Dispense Refill  ? albuterol (VENTOLIN HFA) 108 (90 Base) MCG/ACT inhaler Inhale 2 puffs into the lungs every 4 (four) hours as needed for wheezing or shortness of breath. 18 g 1  ? ALPRAZolam (XANAX) 1 MG tablet TAKE 1/2-1 TABLET BY MOUTH AT BEDTIME AS NEEDED FOR SLEEP. 30 tablet 2  ? Cholecalciferol (VITAMIN D3) 125 MCG/ML LIQD Take 5,000 Units by mouth every 3 (three) days.    ?  triamterene-hydrochlorothiazide (MAXZIDE) 75-50 MG tablet Take 1 tablet by mouth daily. 90 tablet 3  ? verapamil (VERELAN PM) 180 MG 24 hr capsule Take 2 capsules (360 mg total) by mouth at bedtime. 180 capsule 1  ? polyethylene glycol-electrolytes (NULYTELY) 420 g solution As directed (Patient not taking: Reported on 04/19/2021) 4000 mL 0  ? mometasone (ELOCON) 0.1 % cream Apply qhs to irritated area for 5 days prn (Patient taking differently: Apply 1 application. topically daily as needed (scalp irritation).) 15 g 0  ? Probiotic Product (PROBIOTIC DAILY PO) Take 1 capsule by  mouth daily. Nature's Bounty    ? ?No facility-administered medications prior to visit.  ? ? ? ?Objective:  ?  ? ?BP 128/80 (BP Location: Left Arm, Patient Position: Sitting)   Pulse 62   Temp 98 ?F (36.7 ?C) (Temporal)   Ht 5\' 6"  (1.676 m)   Wt 147 lb 6.4 oz (66.9 kg)   SpO2 99% Comment: ra  BMI 23.79 kg/m?  ? ?SpO2: 99 % (ra) ? ?Amb wf challenging historian, minimizes chronic symptoms  ? ?HEENT : pt wearing mask not removed for exam due to covid - 19 concerns.  ? ?NECK :  without JVD/Nodes/TM/ nl carotid upstrokes bilaterally ? ? ?LUNGS: no acc muscle use,  Min barrel  contour chest wall with bilateral insp/exp sonorous rhonchi and  without cough on insp or exp maneuvers and min  Hyperresonant  to  percussion bilaterally   ? ? ?CV:  RRR  no s3 or murmur or increase in P2, and no edema  ? ?ABD:  soft and nontender with pos end  insp Hoover's  in the supine position. No bruits or organomegaly appreciated, bowel sounds nl ? ?MS:   Nl gait/  ext warm without deformities, calf tenderness, cyanosis or clubbing ?No obvious joint restrictions  ? ?SKIN: warm and dry without lesions   ? ?NEURO:  alert, approp, nl sensorium with  no motor or cerebellar deficits apparent.  ? ?CXR PA and Lateral:   04/19/2021 :    ?I personally reviewed images and impression is as follows:     ?Mild/ mod copd pattern ?    ?Labs ordered 04/19/2021  :  allergy profile   alpha one AT phenotype   ?   ?Assessment  ? ?DOE (dyspnea on exertion) ?Onset was fall 2022 though she has "always coughed" ?- 04/19/2021  After extensive coaching inhaler device,  effectiveness =    80% so given breztri sample and rx for symbicort 80 2bid and f/u in 4 weeks  ? ?Pattern is c/w chronic AB which may have evolved to AC0S but need to exclude bad allergic asthma/ ABPA and alpha one AT def and see how she responds to laba / ics on return in 4-6 weeks ? ?Advised:  formulary restrictions will be an ongoing challenge for the forseable future and I would be happy to  pick an alternative if the pt will first  provide me a list of them -  pt  will need to return here for training for any new device that is required eg dpi vs hfa vs respimat.   ? ?In the meantime we can always provide samples so that the patient never runs out of any needed respiratory medications.  ? ? ?    ?  ? ?Each maintenance medication was reviewed in detail including emphasizing most importantly the difference between maintenance and prns and under what circumstances the prns are to be triggered using an action  plan format where appropriate. ? ?Total time for H and P, chart review, counseling, reviewing hfa  device(s) and generating customized AVS unique to this new pt office visit / same day charting = 45 min  ?     ? ? ? ? ?Sandrea HughsMichael Rydge Texidor, MD ?04/19/2021 ?   ?

## 2021-04-19 NOTE — Assessment & Plan Note (Addendum)
Onset was fall 2022 though she has "always coughed" ?- 04/19/2021  After extensive coaching inhaler device,  effectiveness =    80% so given breztri sample and rx for symbicort 80 2bid and f/u in 4 weeks  ? ?Pattern is c/w chronic AB which may have evolved to AC0S but need to exclude bad allergic asthma/ ABPA and alpha one AT def and see how she responds to laba / ics on return in 4-6 weeks ? ?Advised:  formulary restrictions will be an ongoing challenge for the forseable future and I would be happy to pick an alternative if the pt will first  provide me a list of them -  pt  will need to return here for training for any new device that is required eg dpi vs hfa vs respimat.   ? ?In the meantime we can always provide samples so that the patient never runs out of any needed respiratory medications.  ? ? ?    ?  ? ?Each maintenance medication was reviewed in detail including emphasizing most importantly the difference between maintenance and prns and under what circumstances the prns are to be triggered using an action plan format where appropriate. ? ?Total time for H and P, chart review, counseling, reviewing hfa  device(s) and generating customized AVS unique to this new pt office visit / same day charting = 45 min  ?     ? ?

## 2021-04-19 NOTE — Patient Instructions (Addendum)
Plan A = Automatic = Always=    Symbicort 80 (Breztri) Take 2 puffs first thing in am and then another 2 puffs about 12 hours later.  ?  ?Work on inhaler technique:  relax and gently blow all the way out then take a nice smooth full deep breath back in, triggering the inhaler at same time you start breathing in.  Hold for up to 5 seconds if you can. Blow out thru nose. Rinse and gargle with water when done.  If mouth or throat bother you at all,  try brushing teeth/gums/tongue with arm and hammer toothpaste/ make a slurry and gargle and spit out.  ? ?   ?Plan B = Backup (to supplement plan A, not to replace it) ?Only use your albuterol inhaler as a rescue medication to be used if you can't catch your breath by resting or doing a relaxed purse lip breathing pattern.  ?- The less you use it, the better it will work when you need it. ?- Ok to use the inhaler up to 2 puffs  every 4 hours if you must but call for appointment if use goes up over your usual need ?- Don't leave home without it !!  (think of it like the spare tire for your car)  ? ?Please remember to go to the lab department   for your tests - we will call you with the results when they are available. ?    ? ?Please remember to go to the  x-ray department  @  [facility] for your tests - we will call you with the results when they are available    ? ?Please schedule a follow up office visit in 4 weeks, sooner if needed  - bring inhalers  ? ? ? ?  ?

## 2021-04-21 LAB — ALPHA-1-ANTITRYPSIN PHENOTYP: A-1 Antitrypsin: 166 mg/dL (ref 101–187)

## 2021-04-21 LAB — BRAIN NATRIURETIC PEPTIDE: BNP: 36.2 pg/mL (ref 0.0–100.0)

## 2021-04-21 LAB — IGE: IgE (Immunoglobulin E), Serum: 22 IU/mL (ref 6–495)

## 2021-04-25 ENCOUNTER — Other Ambulatory Visit: Payer: Self-pay | Admitting: Internal Medicine

## 2021-04-25 DIAGNOSIS — F411 Generalized anxiety disorder: Secondary | ICD-10-CM

## 2021-05-20 ENCOUNTER — Ambulatory Visit: Payer: BC Managed Care – PPO | Admitting: Internal Medicine

## 2021-05-20 ENCOUNTER — Encounter: Payer: Self-pay | Admitting: Internal Medicine

## 2021-05-20 DIAGNOSIS — R0609 Other forms of dyspnea: Secondary | ICD-10-CM

## 2021-05-20 DIAGNOSIS — R058 Other specified cough: Secondary | ICD-10-CM | POA: Diagnosis not present

## 2021-05-20 MED ORDER — FAMOTIDINE 20 MG PO TABS
ORAL_TABLET | ORAL | 11 refills | Status: DC
Start: 2021-05-20 — End: 2021-06-07

## 2021-05-20 MED ORDER — PANTOPRAZOLE SODIUM 40 MG PO TBEC: 40.0000 mg | DELAYED_RELEASE_TABLET | Freq: Every day | ORAL | 2 refills | Status: DC

## 2021-05-20 NOTE — Progress Notes (Signed)
? ?[name], female    DOB: March 08, 1960  MRN: [id number] ? ? ?Brief patient profile:  ?59  yowf never smoker/MM with childhood allergies  upper / lower= eval by [facility] allergist  on shots x years stopped in 20s  and since then about once a year short of breath/fever/"pleurisy" all that stopped around around 2010 attributed to exercise but still felt needed albuterol with hiking and with damp weather but  no more than once a day referred to pulmonary clinic in [place]  04/19/2021 by Dr Posey Pronto  for new pattern of daily doe  and also happens at rest  ? ? ? ? ?History of Present Illness  ?04/19/2021  Pulmonary/ 1st office eval/ Melvyn Novas / [facility]  ?Chief Complaint  ?Patient presents with  ? Consult  ?  Patient has had changes in breathing. Increased DOE. Used inhaler and was dizzy after using.   ?Dyspnea:  at rest and on treadmill at 4 mph flat does ok then  6 mph immediately heart pounding and sob ?Cough: "always had it" pattern is  sporadic worse with damp weather /albuterol helps cough up mucus white ?Sleep: flat one pillow /no am congestion or noct wheeze/sob ?SABA use:  no more than twice daily and seems to help ?Rec ?Plan A = Automatic = Always=    Symbicort 80 (Breztri) Take 2 puffs first thing in am and then another 2 puffs about 12 hours later.  ? Work on inhaler technique:  ?  Plan B = Backup (to supplement plan A, not to replace it) ?Only use your albuterol inhaler as a rescue medication ?Please schedule a follow up office visit in 4 weeks, sooner if needed  - bring inhalers  ? ? ?05/20/2021  f/u ov/[place] office/Ashleigh Arya re: ? Asthma  maint on symbicort 80 2bid   ?Chief Complaint  ?Patient presents with  ? Follow-up  ?  Sob same since last ov  ?Would like to discuss CXR results from last OV (emphysema?)  ?Dyspnea:  walking x uphill / also sob a rest x 10-15 secs then resolves on its own ?Aerobics = 5 min treadmill  ?Cough: symbicort 80 may be helping but still coughing "all the time forever" minimal  mucoid. ?Sleeping: ok flat  ?SABA use: not at all since started symbicort  ?02: none  ?Covid status: vax x 5  ?  ?No obvious day to day or daytime variability or assoc excess/ purulent sputum or mucus plugs or hemoptysis or cp or chest tightness, subjective wheeze or overt sinus or hb symptoms.  ? ?Sleeping  without nocturnal  or early am exacerbation  of respiratory  c/o's or need for noct saba. Also denies any obvious fluctuation of symptoms with weather or environmental changes or other aggravating or alleviating factors except as outlined above  ? ?No unusual exposure hx or h/o childhood pna/ asthma or knowledge of premature birth. ? ?Current Allergies, Complete Past Medical History, Past Surgical History, Family History, and Social History were reviewed in [employer] record. ? ?ROS  The following are not active complaints unless bolded ?Hoarseness, sore throat, dysphagia, dental problems, itching, sneezing,  nasal congestion or discharge of excess mucus or purulent secretions, ear ache,   fever, chills, sweats, unintended wt loss or wt gain, classically pleuritic or exertional cp,  orthopnea pnd or arm/hand swelling  or leg swelling, presyncope, palpitations, abdominal pain, anorexia, nausea, vomiting, diarrhea  or change in bowel habits or change in bladder habits, change in stools or  change in urine, dysuria, hematuria,  rash, arthralgias, visual complaints, headache, numbness, weakness or ataxia or problems with walking or coordination,  change in mood or  memory. ?      ? ?Current Meds  ?Medication Sig  ? albuterol (VENTOLIN HFA) 108 (90 Base) MCG/ACT inhaler Inhale 2 puffs into the lungs every 4 (four) hours as needed for wheezing or shortness of breath.  ? ALPRAZolam (XANAX) 1 MG tablet TAKE 1/2-1 TABLET BY MOUTH AT BEDTIME AS NEEDED FOR SLEEP.  ? budesonide-formoterol (SYMBICORT) 80-4.5 MCG/ACT inhaler Take 2 puffs first thing in am and then another 2 puffs about 12 hours later.   ? Cholecalciferol (VITAMIN D3) 125 MCG/ML LIQD Take 5,000 Units by mouth every 3 (three) days.  ? polyethylene glycol-electrolytes (NULYTELY) 420 g solution As directed  ? triamterene-hydrochlorothiazide (MAXZIDE) 75-50 MG tablet Take 1 tablet by mouth daily.  ? verapamil (VERELAN PM) 180 MG 24 hr capsule Take 2 capsules (360 mg total) by mouth at bedtime.  ? [DISCONTINUED] Budeson-Glycopyrrol-Formoterol (BREZTRI AEROSPHERE) 160-9-4.8 MCG/ACT AERO Inhale 2 puffs into the lungs in the morning and at bedtime.  ?     ?  ? ? ? ? ?Past Medical History:  ?Diagnosis Date  ? Allergic rhinitis   ? Anxiety   ? Asthma   ? Chronic bronchitis   ? Constipation   ? Depression   ? GERD (gastroesophageal reflux disease)   ? History of eustachian tube dysfunction 09/14/2017  ? Hypertension   ? Migraines   ? Plantar fasciitis   ? PONV (postoperative nausea and vomiting)   ? ? ?  ? ? ? ?Objective:  ?  ? ?  ?Wt Readings from Last 3 Encounters:  ?05/20/21 144 lb 12.8 oz (65.7 kg)  ?04/19/21 147 lb 6.4 oz (66.9 kg)  ?02/05/21 153 lb (69.4 kg)  ?  ? ? ?Vital signs reviewed  05/20/2021  - Note at rest 02 sats  99% on RA  ? ?General appearance:    somber amb wf very focused on "why do they say I have emphysema"   ? ? ? HEENT : nl exam   ? ? ?NECK :  without JVD/Nodes/TM/ nl carotid upstrokes bilaterally ? ? ?LUNGS: no acc muscle use,  Nl contour chest which is clear to A and P bilaterally without cough on insp or exp maneuvers ? ? ?CV:  RRR  no s3 or murmur or increase in P2, and no edema  ? ?ABD:  soft and nontender with nl inspiratory excursion in the supine position. No bruits or organomegaly appreciated, bowel sounds nl ? ?MS:  Nl gait/ ext warm without deformities, calf tenderness, cyanosis or clubbing ?No obvious joint restrictions  ? ?SKIN: warm and dry without lesions   ? ?NEURO:  alert, approp, nl sensorium with  no motor or cerebellar deficits apparent.  ?  ?   ?Assessment  ? ?  ?   ?

## 2021-05-20 NOTE — Patient Instructions (Addendum)
Plan A = Automatic = Always=    symbicort 80  (or Breztri) Take 2 puffs first thing in am and then another 2 puffs about 12 hours later.  ?  ? ? ?Plan B = Backup (to supplement plan A, not to replace it) ?Only use your albuterol inhaler as a rescue medication to be used if you can't catch your breath by resting or doing a relaxed purse lip breathing pattern.  ?- The less you use it, the better it will work when you need it. ?- Ok to use the inhaler up to 2 puffs  every 4 hours if you must but call for appointment if use goes up over your usual need ?- Don't leave home without it !!  (think of it like the spare tire for your car)  ? ?  ?Ok to try albuterol 15 min before an activity (on alternating days)  that you know would usually make you short of breath and see if it makes any difference and if makes none then don't take albuterol after activity unless you can't catch your breath as this means it's the resting that helps, not the albuterol. ?    ? ?Pantoprazole (protonix) 40 mg   Take  30-60 min before first meal of the day and Pepcid (famotidine)  20 mg after supper until return to office - this is the best way to tell whether stomach acid is contributing to your problem.   ? ?GERD (REFLUX)  is an extremely common cause of respiratory symptoms just like yours , many times with no obvious heartburn at all.  ? ? It can be treated with medication, but also with lifestyle changes including elevation of the head of your bed (ideally with 6 -8inch blocks under the headboard of your bed),  Smoking cessation, avoidance of late meals, excessive alcohol, and avoid fatty foods, chocolate, peppermint, colas, red wine, and acidic juices such as orange juice.  ?NO MINT OR MENTHOL PRODUCTS SO NO COUGH DROPS  ?USE SUGARLESS CANDY INSTEAD (Jolley ranchers or Stover's or Life Savers) or even ice chips will also do - the key is to swallow to prevent all throat clearing. ?NO OIL BASED VITAMINS - use powdered substitutes.  Avoid fish  oil when coughing.  ? ? To get the most out of exercise, you need to be continuously aware that you are short of breath, but never out of breath, for at least 30 minutes daily. As you improve, it will actually be easier for you to do the same amount of exercise  in  30 minutes so always push to the level where you are short of breath.    ? ? ?We will set you up for cpst in [place] in about 2 weeks and I will call you the results ?

## 2021-05-22 ENCOUNTER — Encounter: Payer: Self-pay | Admitting: Internal Medicine

## 2021-05-22 DIAGNOSIS — R058 Other specified cough: Secondary | ICD-10-CM | POA: Insufficient documentation

## 2021-05-22 NOTE — Assessment & Plan Note (Signed)
Onset was fall 2022 though she has "always coughed" ?- 04/19/2021  After extensive coaching inhaler device,  effectiveness =    80% so given breztri sample and rx for symbicort 80 2bid and f/u in 4 weeks  ?-   04/19/2021  :  allergy screen  Eos 0.1 / IgE 22   alpha one AT phenotype  MM  166  ? ?She may have chronic asthma with remodeling but absence of any noct cough/ wheeze or improvement on symbicort in either cough or ex tol or resting spells of sob doesn't really fit with acute or chronic asthma so best to do CPST p at least 2 weeks of sub max ex x 30 min daily / advised ?

## 2021-05-22 NOTE — Assessment & Plan Note (Addendum)
Coughed "all her life" ?- allergy rx in [place] with shots until her 72s helped but says never stopped coughing  ?-  04/19/2021  :  Allergy screen  Eos 0.1 / IgE 22 ?- 05/20/2021 max rx for GERD>>> ? ?Upper airway cough syndrome (previously labeled PNDS),  is so named because it's frequently impossible to sort out how much is  CR/sinusitis with freq throat clearing (which can be related to primary GERD)   vs  causing  secondary (" extra esophageal")  GERD from wide swings in gastric pressure that occur with throat clearing, often  promoting self use of mint and menthol lozenges that reduce the lower esophageal sphincter tone and exacerbate the problem further in a cyclical fashion.  ? ?These are the same pts (now being labeled as having "irritable larynx syndrome" by some cough centers) who not infrequently have a history of having failed to tolerate ace inhibitors,  dry powder inhalers or biphosphonates or report having atypical/extraesophageal reflux symptoms that don't respond to standard doses of PPI  and are easily confused as having aecopd or asthma flares by even experienced allergists/ pulmonologists (myself included).  ? ?>>> rx max for gerd now/ avoid DPI inhalers >> see avs for instructions unique to this ov ? ?    ?  ? ?Each maintenance medication was reviewed in detail including emphasizing most importantly the difference between maintenance and prns and under what circumstances the prns are to be triggered using an action plan format where appropriate. ? ?Total time for H and P, chart review, counseling, reviewing hfa device(s) and generating customized AVS unique to this office visit / same day charting = 40 min  ?     ? ? ?  ?

## 2021-05-24 ENCOUNTER — Telehealth: Payer: Self-pay

## 2021-05-24 NOTE — Telephone Encounter (Signed)
-----   Message from Nyoka Cowden, MD sent at 05/22/2021  6:34 AM EDT ----- ?Let her know they will do spirometry as part of cpst but if wants a full pft in the meantime to sort out her symptoms can do this in [place] probably this coming week - or can do this later if don't have a clear answer what's going on from cpst results  but that would complete the work up.  Convert this into a phone note ? ?

## 2021-05-24 NOTE — Telephone Encounter (Signed)
ATC patient. No VM available.  

## 2021-05-25 ENCOUNTER — Telehealth (HOSPITAL_COMMUNITY): Payer: Self-pay | Admitting: *Deleted

## 2021-05-25 NOTE — Telephone Encounter (Signed)
Patient left a message with nurse regarding her inhaler and admin prior to exercise. I returned patient call and left voicemail advising her to hold inhaler prior to exercise, to prevent any influence on exercise and responses. However, instructed her to bring with her in the case of rescue admin post-exercise.  ? ?Provided direct contact information for any other questions or concerns prior to CPX.  ? ? ?Reggy Eye, MS, ACSM, [facility] ?Clinical Exercise Physiologist/ Health and Wellness Coach  ? ?

## 2021-06-07 ENCOUNTER — Encounter: Payer: Self-pay | Admitting: Internal Medicine

## 2021-06-07 ENCOUNTER — Ambulatory Visit: Payer: BC Managed Care – PPO | Admitting: Internal Medicine

## 2021-06-07 VITALS — BP 118/82 | HR 47 | Resp 16 | Ht 66.0 in | Wt 144.2 lb

## 2021-06-07 DIAGNOSIS — F411 Generalized anxiety disorder: Secondary | ICD-10-CM | POA: Diagnosis not present

## 2021-06-07 DIAGNOSIS — K219 Gastro-esophageal reflux disease without esophagitis: Secondary | ICD-10-CM

## 2021-06-07 DIAGNOSIS — E782 Mixed hyperlipidemia: Secondary | ICD-10-CM

## 2021-06-07 DIAGNOSIS — R058 Other specified cough: Secondary | ICD-10-CM

## 2021-06-07 DIAGNOSIS — I10 Essential (primary) hypertension: Secondary | ICD-10-CM

## 2021-06-07 DIAGNOSIS — M810 Age-related osteoporosis without current pathological fracture: Secondary | ICD-10-CM

## 2021-06-07 NOTE — Patient Instructions (Signed)
Please continue taking medications as prescribed.  Please continue to follow low salt diet and perform moderate exercise/walking at least 150 mins/week. 

## 2021-06-07 NOTE — Progress Notes (Signed)
? ?Established Patient Office Visit ? ?Subjective:  ?Patient ID: [name], female    DOB: 13-Jan-1961  Age: 61 y.o. MRN: [id number] ? ?CC:  ?Chief Complaint  ?Patient presents with  ? Follow-up  ?  4 month follow up HTN and anxiety   ? ? ?HPI ?[name] is a 61 y.o. female with past medical history of HTN, GERD,  b/l hearing loss, osteoporosis and GAD who presents for f/u of her chronic medical conditions. ? ?HTN: BP is well-controlled now with increased dose of Maxzide. Takes medications regularly. Patient denies headache, dizziness, chest pain, dyspnea or palpitations. ?  ?She takes Xanax 0.5 to 1 tablet at bedtime for anxiety/insomnia.  She requests a refill for it.  Denies any anhedonia, SI or HI. ? ?She has seen Dr. Melvyn Novas for chronic cough and dyspnea. She has started using Symbicort with some relief. ? ?Past Medical History:  ?Diagnosis Date  ? Allergic rhinitis   ? Anxiety   ? Asthma   ? Chronic bronchitis   ? Constipation   ? Depression   ? GERD (gastroesophageal reflux disease)   ? History of eustachian tube dysfunction 09/14/2017  ? Hypertension   ? Migraines   ? Plantar fasciitis   ? PONV (postoperative nausea and vomiting)   ? ? ?Past Surgical History:  ?Procedure Laterality Date  ? COLONOSCOPY  MAY 2012 ARS  ? SLF: NSAID COLON ULCERS, SML IH  ? COLONOSCOPY WITH PROPOFOL N/A 12/14/2020  ? Procedure: COLONOSCOPY WITH PROPOFOL;  Surgeon: Eloise Harman, DO;  Location: [facility] SU[facility];  Service: Endoscopy;  Laterality: N/A;  7:30am  ? KNEE ARTHROSCOPY WITH MEDIAL MENISECTOMY Left 10/05/2012  ? Procedure: LEFT KNEE ARTHROSCOPY WITH MEDIAL MENISECTOMY;  Surgeon: Carole Civil, MD;  Location: [facility] ORS;  Service: Orthopedics;  Laterality: Left;  ? MYRINGOTOMY    ? NASAL SINUS SURGERY    ? right foot    ? plantar fascitis  ? TONSILLECTOMY    ? UPPER GASTROINTESTINAL ENDOSCOPY  MAY 2012 DYSPHAGIA  ? [id number], ESO STR-SAV DIL 15 MM  ? UPPER GASTROINTESTINAL ENDOSCOPY  MAY 2012  ? SAV  DIL 16MM, next one w/ propofol  ? ? ?Family History  ?Problem Relation Age of Onset  ? Lung cancer Mother   ?     smoker  ? Hypertension Mother   ? Heart failure Father   ? Colon polyps Neg Hx   ? Crohn's disease Neg Hx   ? ? ?Social History  ? ?Socioeconomic History  ? Marital status: Single  ?  Spouse name: Not on file  ? Number of children: 0  ? Years of education: Not on file  ? Highest education level: Not on file  ?Occupational History  ? Occupation: Scientist, forensic  ?  Comment: [employer]  ?Tobacco Use  ? Smoking status: Never  ? Smokeless tobacco: Never  ?Vaping Use  ? Vaping Use: Never used  ?Substance and Sexual Activity  ? Alcohol use: No  ? Drug use: No  ? Sexual activity: Never  ?  Birth control/protection: Other-see comments  ?Other Topics Concern  ? Not on file  ?Social History Narrative  ? 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  ? ?Social Determinants of Health  ? ?Financial Resource Strain: Not on file  ?Food Insecurity: Not on file  ?Transportation Needs: Not on file  ?Physical Activity: Not on file  ?Stress: Not on file  ?Social Connections: Not on  file  ?Intimate Partner Violence: Not on file  ? ? ?Outpatient Medications Prior to Visit  ?Medication Sig Dispense Refill  ? albuterol (VENTOLIN HFA) 108 (90 Base) MCG/ACT inhaler Inhale 2 puffs into the lungs every 4 (four) hours as needed for wheezing or shortness of breath. 18 g 1  ? ALPRAZolam (XANAX) 1 MG tablet TAKE 1/2-1 TABLET BY MOUTH AT BEDTIME AS NEEDED FOR SLEEP. 30 tablet 2  ? budesonide-formoterol (SYMBICORT) 80-4.5 MCG/ACT inhaler Take 2 puffs first thing in am and then another 2 puffs about 12 hours later. 1 each 12  ? Cholecalciferol (VITAMIN D3) 125 MCG/ML LIQD Take 5,000 Units by mouth every 3 (three) days.    ? polyethylene glycol-electrolytes (NULYTELY) 420 g solution As directed 4000 mL 0  ? triamterene-hydrochlorothiazide (MAXZIDE) 75-50 MG tablet Take 1 tablet  by mouth daily. 90 tablet 3  ? verapamil (VERELAN PM) 180 MG 24 hr capsule Take 2 capsules (360 mg total) by mouth at bedtime. 180 capsule 1  ? famotidine (PEPCID) 20 MG tablet One after supper (Patient not taking: Reported on 06/07/2021) 30 tablet 11  ? pantoprazole (PROTONIX) 40 MG tablet Take 1 tablet (40 mg total) by mouth daily. Take 30-60 min before first meal of the day (Patient not taking: Reported on 06/07/2021) 30 tablet 2  ? ?No facility-administered medications prior to visit.  ? ? ?Allergies  ?Allergen Reactions  ? Levaquin [Levofloxacin In D5w] Nausea Only  ? ? ?ROS ?Review of Systems  ?Constitutional:  Negative for chills and fever.  ?HENT:  Negative for congestion, sinus pressure, sinus pain and sore throat.   ?Eyes:  Negative for pain and discharge.  ?Respiratory:  Positive for cough and shortness of breath.   ?Cardiovascular:  Negative for chest pain and palpitations.  ?Gastrointestinal:  Negative for abdominal pain, diarrhea, nausea and vomiting.  ?Endocrine: Negative for polydipsia and polyuria.  ?Genitourinary:  Negative for dysuria and hematuria.  ?Musculoskeletal:  Negative for neck pain and neck stiffness.  ?Skin:  Negative for rash.  ?Neurological:  Negative for dizziness and weakness.  ?Psychiatric/Behavioral:  Positive for sleep disturbance. Negative for agitation and behavioral problems. The patient is nervous/anxious.   ? ?  ?Objective:  ?  ?Physical Exam ?Vitals reviewed.  ?Constitutional:   ?   General: She is not in acute distress. ?   Appearance: She is not diaphoretic.  ?HENT:  ?   Head: Normocephalic and atraumatic.  ?   Nose: Nose normal.  ?   Mouth/Throat:  ?   Mouth: Mucous membranes are moist.  ?Eyes:  ?   General: No scleral icterus. ?   Extraocular Movements: Extraocular movements intact.  ?Cardiovascular:  ?   Rate and Rhythm: Normal rate and regular rhythm.  ?   Pulses: Normal pulses.  ?   Heart sounds: Normal heart sounds. No murmur heard. ?Pulmonary:  ?   Breath sounds:  Normal breath sounds. No wheezing or rales.  ?Musculoskeletal:  ?   Cervical back: Neck supple. No tenderness.  ?   Right lower leg: No edema.  ?   Left lower leg: No edema.  ?Skin: ?   General: Skin is warm.  ?   Findings: No rash.  ?Neurological:  ?   General: No focal deficit present.  ?   Mental Status: She is alert and oriented to person, place, and time.  ?   Sensory: No sensory deficit.  ?   Motor: No weakness.  ?Psychiatric:     ?  Mood and Affect: Mood normal.     ?   Behavior: Behavior normal.  ? ? ?BP 118/82 (BP Location: Right Arm, Patient Position: Sitting, Cuff Size: Normal)   Pulse (!) 47   Resp 16   Ht 5' 6"  (1.676 m)   Wt 144 lb 3.2 oz (65.4 kg)   SpO2 98%   BMI 23.27 kg/m?  ?Wt Readings from Last 3 Encounters:  ?06/07/21 144 lb 3.2 oz (65.4 kg)  ?05/20/21 144 lb 12.8 oz (65.7 kg)  ?04/19/21 147 lb 6.4 oz (66.9 kg)  ? ? ?Lab Results  ?Component Value Date  ? TSH 1.38 12/10/2019  ? ?Lab Results  ?Component Value Date  ? WBC 6.9 03/01/2021  ? HGB 12.9 03/01/2021  ? HCT 38.0 03/01/2021  ? MCV 88 03/01/2021  ? PLT 278 03/01/2021  ? ?Lab Results  ?Component Value Date  ? NA 141 06/07/2021  ? K 4.7 06/07/2021  ? CO2 27 06/07/2021  ? GLUCOSE 95 06/07/2021  ? BUN 17 06/07/2021  ? CREATININE 1.13 (H) 06/07/2021  ? BILITOT 0.4 06/07/2021  ? ALKPHOS 85 06/07/2021  ? AST 28 06/07/2021  ? ALT 19 06/07/2021  ? PROT 7.4 06/07/2021  ? ALBUMIN 4.4 06/07/2021  ? CALCIUM 10.1 06/07/2021  ? EGFR 55 (L) 06/07/2021  ? ?Lab Results  ?Component Value Date  ? CHOL 220 (H) 06/07/2021  ? ?Lab Results  ?Component Value Date  ? HDL 110 06/07/2021  ? ?Lab Results  ?Component Value Date  ? [place] 98 06/07/2021  ? ?Lab Results  ?Component Value Date  ? TRIG 72 06/07/2021  ? ?Lab Results  ?Component Value Date  ? CHOLHDL 2.0 06/07/2021  ? ?Lab Results  ?Component Value Date  ? HGBA1C 5.5 12/02/2020  ? ? ?  ?Assessment & Plan:  ? ?Problem List Items Addressed This Visit   ? ?  ? Cardiovascular and Mediastinum  ? Essential  hypertension, benign - Primary  ?  BP Readings from Last 1 Encounters:  ?06/07/21 118/82  ?Well-controlled with Maxzide and Verapamil now ?Counseled for compliance with the medications ?Advised DASH diet and moder

## 2021-06-08 ENCOUNTER — Ambulatory Visit (HOSPITAL_COMMUNITY): Payer: BC Managed Care – PPO | Attending: Cardiology

## 2021-06-08 ENCOUNTER — Other Ambulatory Visit (HOSPITAL_COMMUNITY): Payer: Self-pay | Admitting: *Deleted

## 2021-06-08 DIAGNOSIS — F419 Anxiety disorder, unspecified: Secondary | ICD-10-CM | POA: Diagnosis not present

## 2021-06-08 DIAGNOSIS — R0600 Dyspnea, unspecified: Secondary | ICD-10-CM | POA: Diagnosis present

## 2021-06-08 DIAGNOSIS — I10 Essential (primary) hypertension: Secondary | ICD-10-CM | POA: Diagnosis not present

## 2021-06-08 DIAGNOSIS — R0609 Other forms of dyspnea: Secondary | ICD-10-CM

## 2021-06-08 DIAGNOSIS — M722 Plantar fascial fibromatosis: Secondary | ICD-10-CM | POA: Insufficient documentation

## 2021-06-08 DIAGNOSIS — J42 Unspecified chronic bronchitis: Secondary | ICD-10-CM | POA: Diagnosis not present

## 2021-06-08 DIAGNOSIS — G43909 Migraine, unspecified, not intractable, without status migrainosus: Secondary | ICD-10-CM | POA: Insufficient documentation

## 2021-06-08 DIAGNOSIS — J45909 Unspecified asthma, uncomplicated: Secondary | ICD-10-CM | POA: Diagnosis not present

## 2021-06-08 DIAGNOSIS — K219 Gastro-esophageal reflux disease without esophagitis: Secondary | ICD-10-CM | POA: Diagnosis not present

## 2021-06-08 DIAGNOSIS — F32A Depression, unspecified: Secondary | ICD-10-CM | POA: Insufficient documentation

## 2021-06-08 LAB — CMP14+EGFR
ALT: 19 IU/L (ref 0–32)
AST: 28 IU/L (ref 0–40)
Albumin/Globulin Ratio: 1.5 (ref 1.2–2.2)
Albumin: 4.4 g/dL (ref 3.8–4.8)
Alkaline Phosphatase: 85 IU/L (ref 44–121)
BUN/Creatinine Ratio: 15 (ref 12–28)
BUN: 17 mg/dL (ref 8–27)
Bilirubin Total: 0.4 mg/dL (ref 0.0–1.2)
CO2: 27 mmol/L (ref 20–29)
Calcium: 10.1 mg/dL (ref 8.7–10.3)
Chloride: 101 mmol/L (ref 96–106)
Creatinine, Ser: 1.13 mg/dL — ABNORMAL HIGH (ref 0.57–1.00)
Globulin, Total: 3 g/dL (ref 1.5–4.5)
Glucose: 95 mg/dL (ref 70–99)
Potassium: 4.7 mmol/L (ref 3.5–5.2)
Sodium: 141 mmol/L (ref 134–144)
Total Protein: 7.4 g/dL (ref 6.0–8.5)
eGFR: 55 mL/min/{1.73_m2} — ABNORMAL LOW (ref >59)

## 2021-06-08 LAB — LIPID PANEL
Chol/HDL Ratio: 2 ratio (ref 0.0–4.4)
Cholesterol, Total: 220 mg/dL — ABNORMAL HIGH (ref 100–199)
HDL: 110 mg/dL (ref >39)
LDL Chol Calc (NIH): 98 mg/dL (ref 0–99)
Triglycerides: 72 mg/dL (ref 0–149)
VLDL Cholesterol Cal: 12 mg/dL (ref 5–40)

## 2021-06-10 NOTE — Assessment & Plan Note (Signed)
Avoid hot and spicy food ?Used to take PPI ?

## 2021-06-10 NOTE — Assessment & Plan Note (Signed)
Well-controlled currently Takes Xanax 0.5-1 tablet qHS - refilled after reviewing PDMP 

## 2021-06-10 NOTE — Assessment & Plan Note (Signed)
BP Readings from Last 1 Encounters:  ?06/07/21 118/82  ? ?Well-controlled with Maxzide and Verapamil now ?Counseled for compliance with the medications ?Advised DASH diet and moderate exercise/walking, at least 150 mins/week ?

## 2021-06-10 NOTE — Assessment & Plan Note (Signed)
Has been evaluated by pulmonology ?Was started on Symbicort ?Planned to get PFTs  ?

## 2021-06-10 NOTE — Assessment & Plan Note (Signed)
DEXA scan in 2021 showed osteoporosis Takes only Vit D supplement 6000 IU drops Does not want bisphosphonate or any treatment for osteoporosis for now Advised to take Caltrate 600+ D3 BID 

## 2021-06-11 NOTE — Progress Notes (Signed)
Spoke with pt and notified of results per Dr. Wert. Pt verbalized understanding and denied any questions. 

## 2021-06-15 ENCOUNTER — Encounter: Payer: Self-pay | Admitting: Internal Medicine

## 2021-06-21 ENCOUNTER — Encounter: Payer: Self-pay | Admitting: Internal Medicine

## 2021-06-21 ENCOUNTER — Telehealth: Payer: Self-pay | Admitting: Internal Medicine

## 2021-06-21 ENCOUNTER — Ambulatory Visit (INDEPENDENT_AMBULATORY_CARE_PROVIDER_SITE_OTHER): Payer: BC Managed Care – PPO | Admitting: Internal Medicine

## 2021-06-21 DIAGNOSIS — J0110 Acute frontal sinusitis, unspecified: Secondary | ICD-10-CM

## 2021-06-21 MED ORDER — FLUTICASONE PROPIONATE 50 MCG/ACT NA SUSP
2.0000 | Freq: Every day | NASAL | 6 refills | Status: DC
Start: 2021-06-21 — End: 2021-07-26

## 2021-06-21 MED ORDER — AMOXICILLIN-POT CLAVULANATE 875-125 MG PO TABS
1.0000 | ORAL_TABLET | Freq: Two times a day (BID) | ORAL | 0 refills | Status: DC
Start: 2021-06-21 — End: 2021-06-29

## 2021-06-21 NOTE — Progress Notes (Signed)
Virtual Visit via Telephone Note   This visit type was conducted due to national recommendations for restrictions regarding the COVID-19 Pandemic (e.g. social distancing) in an effort to limit this patient's exposure and mitigate transmission in our community.  Due to her co-morbid illnesses, this patient is at least at moderate risk for complications without adequate follow up.  This format is felt to be most appropriate for this patient at this time.  The patient did not have access to video technology/had technical difficulties with video requiring transitioning to audio format only (telephone).  All issues noted in this document were discussed and addressed.  No physical exam could be performed with this format.  Evaluation Performed:  Follow-up visit  Date:  06/21/2021   ID:  [name], DOB 05/07/1960, MRN [id number]  Patient Location: Home Provider Location: Office/Clinic  Participants: Patient Location of Patient: Home Location of Provider: Telehealth Consent was obtain for visit to be over via telehealth. I verified that I am speaking with the correct person using two identifiers.  PCP:  Lindell Spar, MD   Chief Complaint: Sinus pressure  History of Present Illness:    [name] is a 61 y.o. female who has a televisit for sinus pressure, nasal congestion, fatigue, fever and myalgias for the last 4 days. She went to mountains and did hiking, which made her symptoms worse. She had 2 negative COVID tests at home. She denies any dyspnea or wheezing.  The patient does not have symptoms concerning for COVID-19 infection (fever, chills, cough, or new shortness of breath).   Past Medical, Surgical, Social History, Allergies, and Medications have been Reviewed.  Past Medical History:  Diagnosis Date   Allergic rhinitis    Anxiety    Asthma    Chronic bronchitis    Constipation    Depression    GERD (gastroesophageal reflux disease)    History of  eustachian tube dysfunction 09/14/2017   Hypertension    Migraines    Plantar fasciitis    PONV (postoperative nausea and vomiting)    Past Surgical History:  Procedure Laterality Date   COLONOSCOPY  MAY 2012 ARS   SLF: NSAID COLON ULCERS, SML IH   COLONOSCOPY WITH PROPOFOL N/A 12/14/2020   Procedure: COLONOSCOPY WITH PROPOFOL;  Surgeon: Eloise Harman, DO;  Location: [facility];  Service: Endoscopy;  Laterality: N/A;  7:30am   KNEE ARTHROSCOPY WITH MEDIAL MENISECTOMY Left 10/05/2012   Procedure: LEFT KNEE ARTHROSCOPY WITH MEDIAL MENISECTOMY;  Surgeon: Carole Civil, MD;  Location: [facility];  Service: Orthopedics;  Laterality: Left;   MYRINGOTOMY     NASAL SINUS SURGERY     right foot     plantar fascitis   TONSILLECTOMY     UPPER GASTROINTESTINAL ENDOSCOPY  MAY 2012 DYSPHAGIA   [id number], ESO STR-SAV DIL 15 MM   UPPER GASTROINTESTINAL ENDOSCOPY  MAY 2012   SAV DIL 16MM, next one w/ propofol     Current Meds  Medication Sig   albuterol (VENTOLIN HFA) 108 (90 Base) MCG/ACT inhaler Inhale 2 puffs into the lungs every 4 (four) hours as needed for wheezing or shortness of breath.   ALPRAZolam (XANAX) 1 MG tablet TAKE 1/2-1 TABLET BY MOUTH AT BEDTIME AS NEEDED FOR SLEEP.   amoxicillin-clavulanate (AUGMENTIN) 875-125 MG tablet Take 1 tablet by mouth 2 (two) times daily.   budesonide-formoterol (SYMBICORT) 80-4.5 MCG/ACT inhaler Take 2 puffs first thing in am and then another 2 puffs about 12 hours  later.   Cholecalciferol (VITAMIN D3) 125 MCG/ML LIQD Take 5,000 Units by mouth every 3 (three) days.   fluticasone (FLONASE) 50 MCG/ACT nasal spray Place 2 sprays into both nostrils daily.   polyethylene glycol-electrolytes (NULYTELY) 420 g solution As directed   triamterene-hydrochlorothiazide (MAXZIDE) 75-50 MG tablet Take 1 tablet by mouth daily.   verapamil (VERELAN PM) 180 MG 24 hr capsule Take 2 capsules (360 mg total) by mouth at bedtime.     Allergies:   Levaquin  [levofloxacin in d5w]   ROS:   Please see the history of present illness.     All other systems reviewed and are negative.   Labs/Other Tests and Data Reviewed:    Recent Labs: 03/01/2021: Hemoglobin 12.9; Platelets 278 04/19/2021: BNP 36.2 06/07/2021: ALT 19; BUN 17; Creatinine, Ser 1.13; Potassium 4.7; Sodium 141   Recent Lipid Panel Lab Results  Component Value Date/Time   CHOL 220 (H) 06/07/2021 09:50 AM   TRIG 72 06/07/2021 09:50 AM   HDL 110 06/07/2021 09:50 AM   CHOLHDL 2.0 06/07/2021 09:50 AM   CHOLHDL 2.5 01/06/2014 09:46 AM   LDLCALC 98 06/07/2021 09:50 AM    Wt Readings from Last 3 Encounters:  06/07/21 144 lb 3.2 oz (65.4 kg)  05/20/21 144 lb 12.8 oz (65.7 kg)  04/19/21 147 lb 6.4 oz (66.9 kg)     ASSESSMENT & PLAN:    Acute sinusitis Home COVID tests x 2 negative Could be due to allergies - Flonase prescribed Continue Mucinex PRN for cough and nasal congestion If persistent symptoms, should take Augmentin for bacterial coverage  Time:   Today, I have spent 8 minutes reviewing the chart, including problem list, medications, and with the patient with telehealth technology discussing the above problems.   Medication Adjustments/Labs and Tests Ordered: Current medicines are reviewed at length with the patient today.  Concerns regarding medicines are outlined above.   Tests Ordered: No orders of the defined types were placed in this encounter.   Medication Changes: Meds ordered this encounter  Medications   fluticasone (FLONASE) 50 MCG/ACT nasal spray    Sig: Place 2 sprays into both nostrils daily.    Dispense:  16 g    Refill:  6   amoxicillin-clavulanate (AUGMENTIN) 875-125 MG tablet    Sig: Take 1 tablet by mouth 2 (two) times daily.    Dispense:  14 tablet    Refill:  0     Note: This dictation was prepared with Dragon dictation along with smaller phrase technology. Similar sounding words can be transcribed inadequately or may not be corrected  upon review. Any transcriptional errors that result from this process are unintentional.      Disposition:  Follow up  Signed, Lindell Spar, MD  06/21/2021 2:49 PM     [place]

## 2021-06-21 NOTE — Telephone Encounter (Signed)
Virtual appt made with dr patel 06-21-21

## 2021-06-22 NOTE — Telephone Encounter (Signed)
If she is still having symptoms I'd like to see her and go over the w/u in more detail   If she no longer has unexplained resp symptoms, then f/u is only as needed and she should just return to her PCP who also has a copy of the w/u we did

## 2021-06-22 NOTE — Telephone Encounter (Signed)
Called and spoke to patient. She states that she has not had any further resp symptoms and she will just follow up as needed.  Patient wanted to let Dr. [name] know that her symbicort inhaler was causing a lot of side effects and causing her throat to burn as well and that she has stopped taking. She states she does not want to explore other inhaler options or continue using symbicort, she just wants Dr. [name] to be aware that she is not using.   Patient agreed to call for any further questions/ concerns and follow up as needed.   Dr. [name] routing to you to make you aware otherwise, nothing further needed.

## 2021-06-22 NOTE — Telephone Encounter (Signed)
Pt aware of stress test results that Dr. [name] ordered, but wondered if she still needed a follow-up appt.  Please advise.   Incoming call     Per result note patient needs a follow up.  Dr. [name] are you okay with [place] making a f/u next available or are you wanting to push it out longer since patient was seen in April?

## 2021-06-24 ENCOUNTER — Telehealth: Payer: Self-pay

## 2021-06-24 ENCOUNTER — Ambulatory Visit: Payer: BC Managed Care – PPO | Admitting: Internal Medicine

## 2021-06-24 NOTE — Telephone Encounter (Signed)
Patient called just seen Dr patel 06/21/2021 asking can she come in and get some type of shot if it would help  Symptoms: fever / congestion / coughing / tired / COVID - Call back # [phone]

## 2021-06-24 NOTE — Telephone Encounter (Signed)
Pt is already using afrin and nyquil pt is taking antibiotic chest is hurting she just keeps coughing all the time ask pt when she started antibiotic she stated 06-23-21 advised to keep taking the antibiotic with verbal understanding

## 2021-06-29 ENCOUNTER — Encounter: Payer: Self-pay | Admitting: Internal Medicine

## 2021-06-29 ENCOUNTER — Ambulatory Visit: Payer: BC Managed Care – PPO | Admitting: Internal Medicine

## 2021-06-29 VITALS — BP 128/88 | HR 79 | Resp 18 | Ht 66.0 in | Wt 145.6 lb

## 2021-06-29 DIAGNOSIS — J209 Acute bronchitis, unspecified: Secondary | ICD-10-CM

## 2021-06-29 MED ORDER — AZITHROMYCIN 250 MG PO TABS: ORAL_TABLET | ORAL | 0 refills | Status: AC

## 2021-06-29 MED ORDER — METHYLPREDNISOLONE SODIUM SUCC 125 MG IJ SOLR
125.0000 mg | Freq: Once | INTRAMUSCULAR | Status: AC
  Administered 2021-06-29: 125 mg via INTRAMUSCULAR

## 2021-06-29 MED ORDER — METHYLPREDNISOLONE 4 MG PO TBPK
ORAL_TABLET | ORAL | 0 refills | Status: DC
Start: 2021-06-29 — End: 2021-07-26

## 2021-06-29 NOTE — Patient Instructions (Signed)
Please take Azithromycin as prescribed.  Please take Prednisone as prescribed.  Please continue to use Albuterol inhaler as needed for shortness of breath or wheezing.

## 2021-06-29 NOTE — Telephone Encounter (Signed)
Pt made in office appt 06-29-21 to reevaluate

## 2021-06-29 NOTE — Progress Notes (Signed)
Acute Office Visit  Subjective:    Patient ID: [name], female    DOB: 1960-09-17, 61 y.o.   MRN: [id number]  Chief Complaint  Patient presents with   Follow-up    Pt still having cough congestion fevers fatigue burning in chest no appetite body aches sore throat and weakness since 06-19-21     HPI Patient is in today for complaint of cough, nasal congestion, low-grade fever and fatigue with myalgias for the last 10 days.  She has completed Augmentin for acute sinusitis, but still complains of cough and sore throat as well.  She has been using albuterol inhaler as needed for dyspnea and wheezing.  She has stopped using Symbicort as it was causing severe burning in her mouth and throat.  She denies any nausea, vomiting, chronic constipation or diarrhea.  Denies any rash or tick bite.  Past Medical History:  Diagnosis Date   Allergic rhinitis    Anxiety    Asthma    Chronic bronchitis    Constipation    Depression    GERD (gastroesophageal reflux disease)    History of eustachian tube dysfunction 09/14/2017   Hypertension    Migraines    Plantar fasciitis    PONV (postoperative nausea and vomiting)     Past Surgical History:  Procedure Laterality Date   COLONOSCOPY  MAY 2012 ARS   SLF: NSAID COLON ULCERS, SML IH   COLONOSCOPY WITH PROPOFOL N/A 12/14/2020   Procedure: COLONOSCOPY WITH PROPOFOL;  Surgeon: Lanelle Bal, DO;  Location: [facility];  Service: Endoscopy;  Laterality: N/A;  7:30am   KNEE ARTHROSCOPY WITH MEDIAL MENISECTOMY Left 10/05/2012   Procedure: LEFT KNEE ARTHROSCOPY WITH MEDIAL MENISECTOMY;  Surgeon: Vickki Hearing, MD;  Location: [facility];  Service: Orthopedics;  Laterality: Left;   MYRINGOTOMY     NASAL SINUS SURGERY     right foot     plantar fascitis   TONSILLECTOMY     UPPER GASTROINTESTINAL ENDOSCOPY  MAY 2012 DYSPHAGIA   [id number], ESO STR-SAV DIL 15 MM   UPPER GASTROINTESTINAL ENDOSCOPY  MAY 2012   SAV DIL , next  one w/ propofol    Family History  Problem Relation Age of Onset   Lung cancer Mother        smoker   Hypertension Mother    Heart failure Father    Colon polyps Neg Hx    Crohn's disease Neg Hx     Social History   Socioeconomic History   Marital status: Single    Spouse name: Not on file   Number of children: 0   Years of education: Not on file   Highest education level: Not on file  Occupational History   Occupation: Surveyor, minerals    Comment: [employer]  Tobacco Use   Smoking status: Never   Smokeless tobacco: Never  Vaping Use   Vaping Use: Never used  Substance and Sexual Activity   Alcohol use: No   Drug use: No   Sexual activity: Never    Birth control/protection: Other-see comments  Other Topics Concern   Not on file  Social History Narrative   Single,lives alone.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   Social Determinants of Health   Financial Resource Strain: Not on file  Food Insecurity: Not on file  Transportation Needs: Not on file  Physical Activity: Not on file  Stress: Not on file  Social Connections: Not on file  Intimate Partner Violence: Not on file    Outpatient Medications Prior to Visit  Medication Sig Dispense Refill   albuterol (VENTOLIN HFA) 108 (90 Base) MCG/ACT inhaler Inhale 2 puffs into the lungs every 4 (four) hours as needed for wheezing or shortness of breath. 18 g 1   ALPRAZolam (XANAX) 1 MG tablet TAKE 1/2-1 TABLET BY MOUTH AT BEDTIME AS NEEDED FOR SLEEP. 30 tablet 2   budesonide-formoterol (SYMBICORT) 80-4.5 MCG/ACT inhaler Take 2 puffs first thing in am and then another 2 puffs about 12 hours later. 1 each 12   Cholecalciferol (VITAMIN D3) 125 MCG/ML LIQD Take 5,000 Units by mouth every 3 (three) days.     fluticasone (FLONASE) 50 MCG/ACT nasal spray Place 2 sprays into both nostrils daily. 16 g 6   polyethylene glycol-electrolytes (NULYTELY) 420 g solution As directed 4000  mL 0   triamterene-hydrochlorothiazide (MAXZIDE) 75-50 MG tablet Take 1 tablet by mouth daily. 90 tablet 3   verapamil (VERELAN PM) 180 MG 24 hr capsule Take 2 capsules (360 mg total) by mouth at bedtime. 180 capsule 1   amoxicillin-clavulanate (AUGMENTIN) 875-125 MG tablet Take 1 tablet by mouth 2 (two) times daily. 14 tablet 0   No facility-administered medications prior to visit.    Allergies  Allergen Reactions   Levaquin [Levofloxacin In D5w] Nausea Only    Review of Systems  Constitutional:  Negative for chills and fever.  HENT:  Positive for congestion and sinus pressure. Negative for sore throat.   Eyes:  Negative for pain and discharge.  Respiratory:  Positive for cough and shortness of breath.   Cardiovascular:  Negative for chest pain and palpitations.  Gastrointestinal:  Negative for abdominal pain, diarrhea, nausea and vomiting.  Endocrine: Negative for polydipsia and polyuria.  Genitourinary:  Negative for dysuria and hematuria.  Musculoskeletal:  Negative for neck pain and neck stiffness.  Skin:  Negative for rash.  Neurological:  Positive for headaches. Negative for dizziness and weakness.  Psychiatric/Behavioral:  Positive for sleep disturbance. Negative for agitation and behavioral problems. The patient is nervous/anxious.       Objective:    Physical Exam Vitals reviewed.  Constitutional:      General: She is not in acute distress.    Appearance: She is not diaphoretic.  HENT:     Head: Normocephalic and atraumatic.     Nose: Nose normal.     Mouth/Throat:     Mouth: Mucous membranes are moist.  Eyes:     General: No scleral icterus.    Extraocular Movements: Extraocular movements intact.  Cardiovascular:     Rate and Rhythm: Normal rate and regular rhythm.     Pulses: Normal pulses.     Heart sounds: Normal heart sounds. No murmur heard. Pulmonary:     Breath sounds: Wheezing (Mild, diffuse) present. No rales.  Musculoskeletal:     Cervical back:  Neck supple. No tenderness.     Right lower leg: No edema.     Left lower leg: No edema.  Skin:    General: Skin is warm.     Findings: No rash.  Neurological:     General: No focal deficit present.     Mental Status: She is alert and oriented to person, place, and time.     Sensory: No sensory deficit.     Motor: No weakness.  Psychiatric:        Mood and Affect: Mood normal.        Behavior: Behavior normal.  BP 128/88 (BP Location: Right Arm, Patient Position: Sitting, Cuff Size: Normal)   Pulse 79   Resp 18   Ht 5\' 6"  (1.676 m)   Wt 145 lb 9.6 oz (66 kg)   SpO2 97%   BMI 23.50 kg/m  Wt Readings from Last 3 Encounters:  06/29/21 145 lb 9.6 oz (66 kg)  06/07/21 144 lb 3.2 oz (65.4 kg)  05/20/21 144 lb 12.8 oz (65.7 kg)        Assessment & Plan:   Problem List Items Addressed This Visit    Visit Diagnoses     Acute bronchitis, unspecified organism    -  Primary Started empiric azithromycin Solu-Medrol 125 mg IM today Medrol Dosepak Albuterol inhaler as needed for dyspnea or wheezing Had recent cardiopulmonary stress test - was benign   Relevant Medications   azithromycin (ZITHROMAX) 250 MG tablet   methylPREDNISolone (MEDROL DOSEPAK) 4 MG TBPK tablet   methylPREDNISolone sodium succinate (SOLU-MEDROL) 125 mg/2 mL injection 125 mg (Completed) (Start on 06/29/2021  2:30 PM)        Meds ordered this encounter  Medications   azithromycin (ZITHROMAX) 250 MG tablet    Sig: Take 2 tablets on day 1, then 1 tablet daily on days 2 through 5    Dispense:  6 tablet    Refill:  0   methylPREDNISolone (MEDROL DOSEPAK) 4 MG TBPK tablet    Sig: Take as package instructions.    Dispense:  1 each    Refill:  0   methylPREDNISolone sodium succinate (SOLU-MEDROL) 125 mg/2 mL injection 125 mg     07/01/2021, MD

## 2021-07-09 ENCOUNTER — Other Ambulatory Visit: Payer: Self-pay | Admitting: Internal Medicine

## 2021-07-09 ENCOUNTER — Encounter: Payer: Self-pay | Admitting: Internal Medicine

## 2021-07-09 DIAGNOSIS — R0609 Other forms of dyspnea: Secondary | ICD-10-CM

## 2021-07-09 DIAGNOSIS — J209 Acute bronchitis, unspecified: Secondary | ICD-10-CM

## 2021-07-23 NOTE — Progress Notes (Signed)
HPI: [name] is a 61 y.o. female, who is here today to establish care.  Former PCP: Dr. Allena Katz Last preventive routine visit: 03/04/16  Chronic medical problems: Sensorineural hearing loss, asthma, insomnia, GERD, anxiety, and hypertension.  Hypertension:  She noted some wt loss during  the past few weeks due to illness, decreased oral intake, BP did "drop", so she discontinued medication and BP. She resumed antihypertensives 2-3 days ago. She is on Verapamil 180 mg 1 capsules daily and triamterene-HCTZ 75-50 mg daily. She recently decreased Verapamil from 2 caps to 1 cap 180 mg because she was having dizzy spells at the gym after exercise. BP readings at home:Most low 100's-110's/60-70's, a few SBP 120-130's and DBP's 50's and 80's. HR's mid 50's, reports hx of mild bradycardia. She exercises regularly and has build good endurance, able to lift heavy wts with no much effort. Side effects:None. Negative for unusual or severe headache, visual changes, exertional chest pain,  focal weakness, or edema.  Lab Results  Component Value Date   CREATININE 1.13 (H) 06/07/2021   BUN 17 06/07/2021   NA 141 06/07/2021   K 4.7 06/07/2021   CL 101 06/07/2021   CO2 27 06/07/2021   HLD: Currently she is on nonpharmacologic treatment. Lab Results  Component Value Date   CHOL 220 (H) 06/07/2021   HDL 110 06/07/2021   LDLCALC 98 06/07/2021   TRIG 72 06/07/2021   CHOLHDL 2.0 06/07/2021   Anxiety: Currently she is on alprazolam 1 mg, she takes 1/2 to 1 tablet daily at bedtime as needed.  Medication was last filled on 04/25/2021.  Since 01/2021 she has had episodes of lightheadedness and DOE when walking a hill or climbing stairs. PCP ordered labs 03/01/21 and referred her to see pulmonologist. Intermittent cough for a while, stable otherwise.Hx of upper airway cough syndrome.  Evaluated by Dr [name] on 04/19/21 Symbicort, which she discontinued because ot was burning her tongue and  throat. She was also concerned about using ICS because found on line that this can affect the immune system.  Burning sensation in the middle of her chest radiated to her thoracic back and burping. She was started on PPI and famotidine in 05/2021. She discontinued Pantoprazole because it seemed to aggravate symptoms.  Symptoms have been persistent. She underwent PFT and exercise stress test at Dr [facility] office 06/08/21. ECG:  Resting ECG in normal sinus bradycardia. HR response appropriate. There were no sustained arrhythmias or ST-T changes. BP response mildly hypertensive at peak exercise.  Pre-exercise spirometry shows mild obstruction. The MVV was normal.   4-5 weeks ago she started feeling "funny." She was having fatigue, feverish, lack of appetite, body aches, shaking 15 min after eating,muscle cramps, feeling "weak."[place]ptoms [place]. COVID 19 test negative x 2. Started on abx on Augmentin for possible sinusitis. F/U on 06/29/21 because she was still having symptoms, she was treated with Azithromycin and course of Methylprednisolone for bronchitis. No hx of tobacco use. She is gradually feeling better.  Chest CT was ordered on 07/09/21 because persistent respiratory symptoms.  Review of Systems  Constitutional:  Negative for activity change, appetite change and fever.  HENT:  Negative for mouth sores, nosebleeds and trouble swallowing.   Respiratory:  Negative for wheezing and stridor.   Gastrointestinal:  Negative for abdominal pain, nausea and vomiting.       Negative for changes in bowel habits.  Genitourinary:  Negative for decreased urine volume, dysuria and hematuria.  Allergic/Immunologic: Positive for environmental allergies.  Neurological:  Negative for syncope and facial asymmetry.  Hematological:  Negative for adenopathy. Does not bruise/bleed easily.  Psychiatric/Behavioral:  Negative for confusion. The patient is nervous/anxious.   Rest see  pertinent positives and negatives per HPI.  Current Outpatient Medications on File Prior to Visit  Medication Sig Dispense Refill   ALPRAZolam (XANAX) 1 MG tablet TAKE 1/2-1 TABLET BY MOUTH AT BEDTIME AS NEEDED FOR SLEEP. 30 tablet 2   Cholecalciferol (VITAMIN D3) 125 MCG/ML LIQD Take 5,000 Units by mouth every 3 (three) days.     triamterene-hydrochlorothiazide (MAXZIDE) 75-50 MG tablet Take 1 tablet by mouth daily. 90 tablet 3   verapamil (VERELAN PM) 180 MG 24 hr capsule Take 2 capsules (360 mg total) by mouth at bedtime. 180 capsule 1   No current facility-administered medications on file prior to visit.    Past Medical History:  Diagnosis Date   Allergic rhinitis    Anxiety    Asthma    Chronic bronchitis    Constipation    Depression    GERD (gastroesophageal reflux disease)    History of eustachian tube dysfunction 09/14/2017   Hypertension    Migraines    Plantar fasciitis    PONV (postoperative nausea and vomiting)    Allergies  Allergen Reactions   Levaquin [Levofloxacin In D5w] Nausea Only    Family History  Problem Relation Age of Onset   Lung cancer Mother        smoker   Hypertension Mother    Heart failure Father    Colon polyps Neg Hx    Crohn's disease Neg Hx     Social History   Socioeconomic History   Marital status: Single    Spouse name: Not on file   Number of children: 0   Years of education: Not on file   Highest education level: Not on file  Occupational History   Occupation: Surveyor, minerals    Comment: [employer]  Tobacco Use   Smoking status: Never   Smokeless tobacco: Never  Vaping Use   Vaping Use: Never used  Substance and Sexual Activity   Alcohol use: No   Drug use: No   Sexual activity: Never    Birth control/protection: Other-see comments  Other Topics Concern   Not on file  Social History Narrative   Single,lives alone.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   Social  Determinants of Health   Financial Resource Strain: Not on file  Food Insecurity: Not on file  Transportation Needs: Not on file  Physical Activity: Not on file  Stress: Not on file  Social Connections: Not on file   Vitals:   07/26/21 1357  BP: 110/70  Pulse: 60  Resp: 16  SpO2: 98%   Body mass index is 22.46 kg/m.  Physical Exam Vitals and nursing note reviewed.  Constitutional:      General: She is not in acute distress.    Appearance: She is well-developed.  HENT:     Head: Normocephalic and atraumatic.     Mouth/Throat:     Mouth: Mucous membranes are moist.     Pharynx: Oropharynx is clear.  Eyes:     Conjunctiva/sclera: Conjunctivae normal.  Cardiovascular:     Rate and Rhythm: Normal rate and regular rhythm.     Pulses:          Posterior tibial pulses are 2+ on the right side and 2+ on the  left side.     Heart sounds: No murmur heard. Pulmonary:     Effort: Pulmonary effort is normal. No respiratory distress.     Breath sounds: Normal breath sounds.  Abdominal:     Palpations: Abdomen is soft. There is no hepatomegaly or mass.     Tenderness: There is no abdominal tenderness.  Lymphadenopathy:     Cervical: No cervical adenopathy.  Skin:    General: Skin is warm.     Findings: No erythema or rash.  Neurological:     General: No focal deficit present.     Mental Status: She is alert and oriented to person, place, and time.     Cranial Nerves: No cranial nerve deficit.     Gait: Gait normal.  Psychiatric:     Comments: Well groomed, good eye contact.   ASSESSMENT AND PLAN:  [name] was seen today for establish care.  Diagnoses and all orders for this visit:  Dyspnea on exertion We discussed possible causes. PFT's mild obstruction, negative exercise stress test. CXR 04/20/21 showed emphysema. She does not want ICS containing inhaler. Albuterol inh helps some. She agrees with trial of Combivent, we discussed some side effects. If problem is  persistent she may need Cardiolite stress test, cardio referral will be considered. She is not interested in having chest CT for now, ordered by her former PCP. Clearly instructed about warning signs.  Ipratropium-Albuterol (COMBIVENT RESPIMAT) 20-100 MCG/ACT AERS respimat; Inhale 1 puff into the lungs every 6 (six) hours as needed for wheezing.  Gastroesophageal reflux disease without esophagitis This problem cane be contributing to some of her symptoms. She doe snot want to take PPI's. Burning sensation has improved. Continue GERD precautions.  Essential hypertension, benign Most BP on lower normal range, some mildly low DBP's. Recommend decreasing dose of Triamterene-HCTZ from 75-50 mg to 1/2 tab daily. No changes in Verapamil dose for now but given ehr Hx of bradycardia we will consider changing to a CCB/dihydropyridine like Amlodipine. She has 90 caps at home still, so will let me know when she is about to run out. Continue monitoring BP regularly on low-salt diet. Follow-up in 2 months.  GAD (generalized anxiety disorder) Problem is stable. Continue alprazolam 1 mg 1/2 to 1 tablet daily as needed.  I spent a total of 44 minutes in both face to face and non face to face activities for this visit on the date of this encounter. During this time history was obtained and documented, examination was performed, prior labs/imaging reviewed, and assessment/plan discussed.  Return in about 2 months (around 09/25/2021).  Artin Mceuen G. [place], MD  [facility]. [place] office.

## 2021-07-26 ENCOUNTER — Encounter: Payer: Self-pay | Admitting: Family Medicine

## 2021-07-26 ENCOUNTER — Ambulatory Visit: Payer: BC Managed Care – PPO | Admitting: Family Medicine

## 2021-07-26 VITALS — BP 110/70 | HR 60 | Resp 16 | Ht 66.0 in | Wt 139.1 lb

## 2021-07-26 DIAGNOSIS — K219 Gastro-esophageal reflux disease without esophagitis: Secondary | ICD-10-CM | POA: Diagnosis not present

## 2021-07-26 DIAGNOSIS — F411 Generalized anxiety disorder: Secondary | ICD-10-CM

## 2021-07-26 DIAGNOSIS — R0609 Other forms of dyspnea: Secondary | ICD-10-CM | POA: Diagnosis not present

## 2021-07-26 DIAGNOSIS — I10 Essential (primary) hypertension: Secondary | ICD-10-CM | POA: Diagnosis not present

## 2021-07-26 MED ORDER — COMBIVENT RESPIMAT 20-100 MCG/ACT IN AERS: 1.0000 | INHALATION_SPRAY | Freq: Four times a day (QID) | RESPIRATORY_TRACT | 2 refills | Status: DC | PRN

## 2021-07-27 MED ORDER — STIOLTO RESPIMAT 2.5-2.5 MCG/ACT IN AERS: 2.0000 | INHALATION_SPRAY | Freq: Every day | RESPIRATORY_TRACT | 0 refills | Status: DC

## 2021-08-05 ENCOUNTER — Ambulatory Visit (HOSPITAL_COMMUNITY): Payer: BC Managed Care – PPO

## 2021-09-05 NOTE — Progress Notes (Signed)
HPI: [name] is a 61 y.o. female, who is here today to follow on recent OV. She was seen on 07/26/21, when she was c/o DOE,Stiolto respimat 2 puff daily was started.  DOE and cough have improved,  SOB, "not as bad" when walking a hill. No other associated symptoms. CXR 04/19/21: 1. No radiographic evidence of acute cardiopulmonary disease. 2. Emphysema.  Productive cough , negative for hemoptysis.  HTN: Last OV triamterene-HCTZ dose was decreased from 75-50 mg 1 tab to 1/2 tab. She is also on Verapamil 180 mg 1 cap daily. Negative for severe/frequent headache, visual changes, chest pain,focal weakness, or edema. She has not checked BP regularly since her last visit.  Palpitations, improved. This is a chronic problem, usually happens at bedtime. It does not wakes her up. Not associated with exertion.  Stress test in 05/2021 negative.  Lab Results  Component Value Date   CREATININE 1.13 (H) 06/07/2021   BUN 17 06/07/2021   NA 141 06/07/2021   K 4.7 06/07/2021   CL 101 06/07/2021   CO2 27 06/07/2021   Anxiety on Alprazolam 1 mg , takes 1/2-1 tab at night to help her sleep. Medication also helps her with sleep. She has difficulty falling asleep and going back to sleep is she gets up to urinate. Sleeps in average 7 hours. Negative for depression like symptoms.  For years she has had intermittent rash and pruritus of external ears. + scalp and skin pruritus with no rash. Her audiologist prescribed ear drops to treat ear pruritus.  She would like a cream to apply around external ear. She has not identified exacerbating or alleviating factors.  Review of Systems  Constitutional:  Negative for activity change, appetite change and fever.  HENT:  Negative for mouth sores, nosebleeds and trouble swallowing.   Gastrointestinal:  Negative for abdominal pain, nausea and vomiting.       Negative for changes in bowel habits.  Endocrine: Negative for cold intolerance and  heat intolerance.  Genitourinary:  Negative for decreased urine volume and hematuria.  Neurological:  Negative for syncope, facial asymmetry and weakness.  Psychiatric/Behavioral:  Positive for sleep disturbance. Negative for confusion and hallucinations.   Rest see pertinent positives and negatives per HPI.  Current Outpatient Medications on File Prior to Visit  Medication Sig Dispense Refill   Cholecalciferol (VITAMIN D3) 125 MCG/ML LIQD Take 5,000 Units by mouth every 3 (three) days.     No current facility-administered medications on file prior to visit.   Past Medical History:  Diagnosis Date   Allergic rhinitis    Anxiety    Asthma    Chronic bronchitis    Constipation    Depression    GERD (gastroesophageal reflux disease)    History of eustachian tube dysfunction 09/14/2017   Hypertension    Migraines    Plantar fasciitis    PONV (postoperative nausea and vomiting)    Allergies  Allergen Reactions   Levaquin [Levofloxacin In D5w] Nausea Only   Social History   Socioeconomic History   Marital status: Single    Spouse name: Not on file   Number of children: 0   Years of education: Not on file   Highest education level: Not on file  Occupational History   Occupation: Surveyor, minerals    Comment: [employer]  Tobacco Use   Smoking status: Never   Smokeless tobacco: Never  Vaping Use   Vaping Use: Never used  Substance and Sexual Activity  Alcohol use: No   Drug use: No   Sexual activity: Never    Birth control/protection: Other-see comments  Other Topics Concern   Not on file  Social History Narrative   Single,lives alone.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   Social Determinants of Health   Financial Resource Strain: Not on file  Food Insecurity: Not on file  Transportation Needs: Not on file  Physical Activity: Not on file  Stress: Not on file  Social Connections: Not on file   Vitals:   09/06/21  0853  BP: 122/80  Pulse: (!) 56  Resp: 12  SpO2: 97%   Body mass index is 23.28 kg/m.  Physical Exam Vitals and nursing note reviewed.  Constitutional:      General: She is not in acute distress.    Appearance: She is well-developed.  HENT:     Head: Normocephalic and atraumatic.     Right Ear: Tympanic membrane and ear canal normal. Decreased hearing noted.     Left Ear: Tympanic membrane and ear canal normal. Decreased hearing noted.     Ears:     Comments: Hearing aids, bilateral. Mildly erythematous and finely areas of external ear: tragus, antitragus,and concha.    Mouth/Throat:     Mouth: Mucous membranes are moist.     Pharynx: Oropharynx is clear.  Eyes:     Conjunctiva/sclera: Conjunctivae normal.  Cardiovascular:     Rate and Rhythm: Regular rhythm. Bradycardia present.     Pulses:          Dorsalis pedis pulses are 2+ on the right side and 2+ on the left side.     Heart sounds: No murmur heard. Pulmonary:     Effort: Pulmonary effort is normal. No respiratory distress.     Breath sounds: Normal breath sounds.  Abdominal:     Palpations: Abdomen is soft. There is no hepatomegaly or mass.     Tenderness: There is no abdominal tenderness.  Lymphadenopathy:     Cervical: No cervical adenopathy.  Skin:    General: Skin is warm.     Findings: No erythema or rash.  Neurological:     General: No focal deficit present.     Mental Status: She is alert and oriented to person, place, and time.     Cranial Nerves: No cranial nerve deficit.     Gait: Gait normal.  Psychiatric:     Comments: Well groomed, good eye contact.   ASSESSMENT AND PLAN:  Ms.Kayti was seen today for follow-up.  Diagnoses and all orders for this visit: Orders Placed This Encounter  Procedures   Varicella-zoster vaccine IM   Basic metabolic panel   Lab Results  Component Value Date   CREATININE 1.01 09/06/2021   BUN 19 09/06/2021   NA 138 09/06/2021   K 4.3 09/06/2021   CL 101  09/06/2021   CO2 29 09/06/2021   DOE (dyspnea on exertion) Problem has improved with Stiolto respimat 2 puff daily. CXR showed emphysema. We reviewed differential diagnosis. For now she is not interested in cardiology referral. Instructed about warning signs.  GAD (generalized anxiety disorder) Problem is stable. Continue alprazolam 1 mg 1/2 to 1 tablet daily as needed.  Essential hypertension, benign BP adequately controlled. Continue verapamil 180 mg daily and triamterene-HCTZ 75-50 mg 1/2 tablet daily. Monitor BP regularly. Continue low-salt/DASH diet. We discussed some side effects of medications, today mildly bradycardic, stable for years.  If this gets worse we will need  to discontinue verapamil.  Seborrheic dermatitis Educated about Dx, prognosis,and treatment options. Topical Lotrisone daily as needed, small amount at the time, recommended.  Palpitations This seems to be a chronic problem, improved. Hx does not suggest a serious process. She would like to hold on cardiology referral for now. Instructed about warning signs.  Need for shingles vaccine -     Varicella-zoster vaccine IM  I spent a total of 41 minutes in both face to face and non face to face activities for this visit on the date of this encounter. During this time history was obtained and documented, examination was performed, prior labs/imaging reviewed, and assessment/plan discussed.  Return in about 6 months (around 03/09/2022).  Kolsen Choe G. [place], MD  [facility]. [place] office.

## 2021-09-06 ENCOUNTER — Ambulatory Visit: Payer: BC Managed Care – PPO | Admitting: Family Medicine

## 2021-09-06 ENCOUNTER — Encounter: Payer: Self-pay | Admitting: Family Medicine

## 2021-09-06 VITALS — BP 122/80 | HR 56 | Resp 12 | Ht 66.0 in | Wt 144.2 lb

## 2021-09-06 DIAGNOSIS — Z23 Encounter for immunization: Secondary | ICD-10-CM

## 2021-09-06 DIAGNOSIS — F411 Generalized anxiety disorder: Secondary | ICD-10-CM | POA: Diagnosis not present

## 2021-09-06 DIAGNOSIS — I10 Essential (primary) hypertension: Secondary | ICD-10-CM

## 2021-09-06 DIAGNOSIS — L219 Seborrheic dermatitis, unspecified: Secondary | ICD-10-CM

## 2021-09-06 DIAGNOSIS — R002 Palpitations: Secondary | ICD-10-CM

## 2021-09-06 DIAGNOSIS — R0609 Other forms of dyspnea: Secondary | ICD-10-CM | POA: Diagnosis not present

## 2021-09-06 DIAGNOSIS — K219 Gastro-esophageal reflux disease without esophagitis: Secondary | ICD-10-CM

## 2021-09-06 LAB — BASIC METABOLIC PANEL
BUN: 19 mg/dL (ref 6–23)
CO2: 29 mEq/L (ref 19–32)
Calcium: 9.9 mg/dL (ref 8.4–10.5)
Chloride: 101 mEq/L (ref 96–112)
Creatinine, Ser: 1.01 mg/dL (ref 0.40–1.20)
GFR: 59.63 mL/min — ABNORMAL LOW (ref >60.00)
Glucose, Bld: 81 mg/dL (ref 70–99)
Potassium: 4.3 mEq/L (ref 3.5–5.1)
Sodium: 138 mEq/L (ref 135–145)

## 2021-09-06 MED ORDER — CLOTRIMAZOLE-BETAMETHASONE 1-0.05 % EX CREA: 1.0000 | TOPICAL_CREAM | Freq: Every day | CUTANEOUS | 2 refills | Status: AC | PRN

## 2021-09-06 MED ORDER — ALPRAZOLAM 1 MG PO TABS: 0.5000 mg | ORAL_TABLET | Freq: Every evening | ORAL | 2 refills | Status: DC | PRN

## 2021-09-06 MED ORDER — TRIAMTERENE-HCTZ 75-50 MG PO TABS
0.5000 | ORAL_TABLET | Freq: Every day | ORAL | 3 refills | Status: DC
Start: 2021-09-06 — End: 2022-02-11

## 2021-09-06 MED ORDER — STIOLTO RESPIMAT 2.5-2.5 MCG/ACT IN AERS: 2.0000 | INHALATION_SPRAY | Freq: Every day | RESPIRATORY_TRACT | 6 refills | Status: DC

## 2021-09-06 MED ORDER — VERAPAMIL HCL ER 180 MG PO CP24: 180.0000 mg | ORAL_CAPSULE | Freq: Every day | ORAL | 1 refills | Status: DC

## 2021-09-06 NOTE — Patient Instructions (Addendum)
A few things to remember from today's visit:  Essential hypertension, benign - Plan: Basic metabolic panel, verapamil (VERELAN PM) 180 MG 24 hr capsule, triamterene-hydrochlorothiazide (MAXZIDE) 75-50 MG tablet  DOE (dyspnea on exertion)  GAD (generalized anxiety disorder) - Plan: ALPRAZolam (XANAX) 1 MG tablet  Seborrheic dermatitis - Plan: clotrimazole-betamethasone (LOTRISONE) cream  If you need refills please call your pharmacy. Do not use My Chart to request refills or for acute issues that need immediate attention.   Please let me know if you decide to go a head with cardiologist. No changes today.  Please be sure medication list is accurate. If a new problem present, please set up appointment sooner than planned today.  Seborrheic Dermatitis, Adult Seborrheic dermatitis is a skin disease that causes red, scaly patches. It usually occurs on the scalp, and it is often called dandruff. The patches may appear on other parts of the body. Skin patches tend to appear where there are many oil glands in the skin. Areas of the body that are commonly affected include the: Scalp. Ears. Eyebrows. Face. Bearded area of [employer]. Skin folds of the body, such as the armpits, groin, and buttocks. Chest. The condition may come and go for no known reason, and it is often long-lasting (chronic). What are the causes? The cause of this condition is not known. What increases the risk? The following factors may make you more likely to develop this condition: Having certain conditions, such as: HIV (human immunodeficiency virus). AIDS (acquired immunodeficiency syndrome). Parkinson's disease. Mood disorders, such as depression. Being 49-76 years old. What are the signs or symptoms? Symptoms of this condition include: Thick scales on the scalp. Redness on the face or in the armpits. Skin that is flaky. The flakes may be white or yellow. Skin that seems oily or dry but is not helped with  moisturizers. Itching or burning in the affected areas. How is this diagnosed? This condition is diagnosed with a medical history and physical exam. A sample of your skin may be tested (skin biopsy). You may need to see a skin specialist (dermatologist). How is this treated? There is no cure for this condition, but treatment can help to manage the symptoms. You may get treatment to remove scales, lower the risk of skin infection, and reduce swelling or itching. Treatment may include: Creams that reduce skin yeast. Medicated shampoo. Moisturizing creams or ointments. Creams that reduce swelling and irritation (steroids). Follow these instructions at home: Apply over-the-counter and prescription medicines only as told by your health care provider. Use any medicated shampoo, skin creams, or ointments only as told by your health care provider. Keep all follow-up visits as told by your health care provider. This is important. Contact a health care provider if: Your symptoms do not improve with treatment. Your symptoms get worse. You have new symptoms. Get help right away if: Your condition rapidly worsens with treatment. Summary Seborrheic dermatitis is a skin disease that causes red, scaly patches. Seborrheic dermatitis commonly affects the scalp, face, and skin folds. There is no cure for this condition, but treatment can help to manage the symptoms. This information is not intended to replace advice given to you by your health care provider. Make sure you discuss any questions you have with your health care provider. Document Revised: 03/31/2021 Document Reviewed: 10/25/2018 Elsevier Patient Education  2023 [employer].

## 2021-09-06 NOTE — Assessment & Plan Note (Signed)
BP adequately controlled. Continue verapamil 180 mg daily and triamterene-HCTZ 75-50 mg 1/2 tablet daily. Monitor BP regularly. Continue low-salt/DASH diet. We discussed some side effects of medications, today mildly bradycardic, stable for years.  If this gets worse we will need to discontinue verapamil.

## 2021-09-06 NOTE — Assessment & Plan Note (Signed)
Problem is stable. Continue alprazolam 1 mg 1/2 to 1 tablet daily as needed.

## 2021-09-06 NOTE — Assessment & Plan Note (Addendum)
We discussed possible etiologies. SOB and cough have improved with Stiolto respimat 2 puff daily.  CXR showed emphysema. We reviewed differential diagnosis. For now she is not interested in cardiology referral. Instructed about warning signs.

## 2021-09-06 NOTE — Assessment & Plan Note (Signed)
Educated about Dx, prognosis,and treatment options. Topical Lotrisone daily as needed, small amount at the time, recommended.

## 2021-10-11 ENCOUNTER — Encounter: Payer: BC Managed Care – PPO | Admitting: Internal Medicine

## 2021-11-13 ENCOUNTER — Other Ambulatory Visit: Payer: Self-pay | Admitting: Family Medicine

## 2021-11-13 DIAGNOSIS — F411 Generalized anxiety disorder: Secondary | ICD-10-CM

## 2021-11-15 NOTE — Telephone Encounter (Signed)
Last refill 11/13/21. Next refill 12/14/21, Rx sent. [facility]

## 2021-11-15 NOTE — Telephone Encounter (Signed)
Last filled 10/07/21

## 2021-11-26 ENCOUNTER — Ambulatory Visit (INDEPENDENT_AMBULATORY_CARE_PROVIDER_SITE_OTHER): Payer: BC Managed Care – PPO

## 2021-11-26 ENCOUNTER — Ambulatory Visit: Payer: BC Managed Care – PPO | Admitting: Orthopedic Surgery

## 2021-11-26 ENCOUNTER — Encounter: Payer: Self-pay | Admitting: Orthopedic Surgery

## 2021-11-26 VITALS — BP 140/81 | HR 64 | Ht 66.0 in | Wt 146.0 lb

## 2021-11-26 DIAGNOSIS — G8929 Other chronic pain: Secondary | ICD-10-CM

## 2021-11-26 DIAGNOSIS — M25512 Pain in left shoulder: Secondary | ICD-10-CM | POA: Diagnosis not present

## 2021-11-26 DIAGNOSIS — M7582 Other shoulder lesions, left shoulder: Secondary | ICD-10-CM

## 2021-11-26 NOTE — Patient Instructions (Signed)

## 2021-11-27 NOTE — Progress Notes (Signed)
New Patient Visit  Assessment: [name] is a 61 y.o. female with the following: 1. Rotator cuff tendonitis, left  Plan: [name] has pain in her left shoulder.  Atraumatic onset.  Radiographs are negative.  She has decent range of motion.  She has had similar pain in the past, and her right shoulder, and this responded well to an injection.  I offered her an injection in clinic today, and she elected to proceed.  This was completed without issues.  Home exercises provided.  She will follow-up as needed.   Procedure note injection Left shoulder    Verbal consent was obtained to inject the left shoulder, subacromial space Timeout was completed to confirm the site of injection.  The skin was prepped with alcohol and ethyl chloride was sprayed at the injection site.  A 21-gauge needle was used to inject 40 mg of Depo-Medrol and 1% lidocaine (3 cc) into the subacromial space of the left shoulder using a posterolateral approach.  There were no complications. A sterile bandage was applied.   Follow-up: Return if symptoms worsen or fail to improve.  Subjective:  Chief Complaint  Patient presents with   Shoulder Pain    Lt shoulder pain for 3 mos     History of Present Illness: [name] is a 61 y.o. female who presents for evaluation of left shoulder pain.  She states she has had pain over the lateral shoulder for the past 3 months.  Atraumatic onset.  She has difficulty with some overhead motion.  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  Medications have not been helpful.  She has seen Dr. Aline Brochure for her right shoulder in the past.  This responded very well to an injection.  She has not had an injection in her left shoulder.   Review of Systems: No fevers or chills No numbness or tingling No chest pain No shortness of breath No bowel or bladder dysfunction No GI distress No  headaches   Medical History:  Past Medical History:  Diagnosis Date   Allergic rhinitis    Anxiety    Asthma    Chronic bronchitis    Constipation    Depression    GERD (gastroesophageal reflux disease)    History of eustachian tube dysfunction 09/14/2017   Hypertension    Migraines    Plantar fasciitis    PONV (postoperative nausea and vomiting)     Past Surgical History:  Procedure Laterality Date   COLONOSCOPY  MAY 2012 ARS   SLF: NSAID COLON ULCERS, SML IH   COLONOSCOPY WITH PROPOFOL N/A 12/14/2020   Procedure: COLONOSCOPY WITH PROPOFOL;  Surgeon: Eloise Harman, DO;  Location: [facility];  Service: Endoscopy;  Laterality: N/A;  7:30am   KNEE ARTHROSCOPY WITH MEDIAL MENISECTOMY Left 10/05/2012   Procedure: LEFT KNEE ARTHROSCOPY WITH MEDIAL MENISECTOMY;  Surgeon: Carole Civil, MD;  Location: [facility];  Service: Orthopedics;  Laterality: Left;   MYRINGOTOMY     NASAL SINUS SURGERY     right foot     plantar fascitis   TONSILLECTOMY     UPPER GASTROINTESTINAL ENDOSCOPY  MAY 2012 DYSPHAGIA   EA:[id number], ESO STR-SAV DIL 15 MM   UPPER GASTROINTESTINAL ENDOSCOPY  MAY 2012   SAV DIL 16MM, next one w/ propofol    Family History  Problem Relation Age of Onset   Lung cancer Mother        smoker  Hypertension Mother    Heart failure Father    Colon polyps Neg Hx    Crohn's disease Neg Hx    Social History   Tobacco Use   Smoking status: Never   Smokeless tobacco: Never  Vaping Use   Vaping Use: Never used  Substance Use Topics   Alcohol use: No   Drug use: No    Allergies  Allergen Reactions   Levaquin [Levofloxacin In D5w] Nausea Only    No outpatient medications have been marked as taking for the 11/26/21 encounter (Office Visit) with Oliver Barre, MD.    Objective: BP (!) 140/81   Pulse 64   Ht 5\' 6"  (1.676 m)   Wt 146 lb (66.2 kg)   BMI 23.57 kg/m   Physical Exam:  General: Alert and oriented. and No acute  distress. Gait: Normal gait.  Left shoulder without deformity.  No atrophy is appreciated.  140 degrees of forward flexion until it is painful.  Internal rotation of the lumbar spine.  Positive Neer's.  Negative belly press.  Fingers are warm and well-perfused.  Sensation intact throughout the left hand.  IMAGING: I personally ordered and reviewed the following images  X-rays of the left shoulder obtained in clinic today.  No acute injuries noted.  Glenohumeral joint is maintained.  No proximal humeral migration.  Minimal degenerative changes of the [facility] joint.  Impression: Negative left shoulder x-ray.  New Medications:  No orders of the defined types were placed in this encounter.     [facility], MD  11/27/2021 10:13 PM

## 2021-12-27 ENCOUNTER — Telehealth: Payer: Self-pay

## 2021-12-27 NOTE — Telephone Encounter (Signed)
Patient is asking if she can get another injection in her left shoulder now.  Stated that her range of motion is not what she thinks it should be.  Please advise

## 2021-12-28 NOTE — Telephone Encounter (Signed)
Left VM with information and call back # for further directions.

## 2021-12-29 NOTE — Telephone Encounter (Signed)
Patient called and LMVM this morning for [name], returning her call. [phone]

## 2021-12-31 ENCOUNTER — Encounter: Payer: Self-pay | Admitting: Family Medicine

## 2021-12-31 ENCOUNTER — Ambulatory Visit: Payer: BC Managed Care – PPO | Admitting: Family Medicine

## 2021-12-31 VITALS — BP 124/70 | HR 86 | Temp 98.8°F | Resp 16 | Ht 66.0 in | Wt 153.1 lb

## 2021-12-31 DIAGNOSIS — L02512 Cutaneous abscess of left hand: Secondary | ICD-10-CM | POA: Diagnosis not present

## 2021-12-31 DIAGNOSIS — L03012 Cellulitis of left finger: Secondary | ICD-10-CM

## 2021-12-31 MED ORDER — SULFAMETHOXAZOLE-TRIMETHOPRIM 800-160 MG PO TABS: 1.0000 | ORAL_TABLET | Freq: Two times a day (BID) | ORAL | 0 refills | Status: AC

## 2021-12-31 NOTE — Patient Instructions (Signed)
A few things to remember from today's visit:  Abscess of left hand - Plan: sulfamethoxazole-trimethoprim (BACTRIM DS) 800-160 MG tablet  Soak hand in warm water with Epson salt daily. Bactrim 2 times daily for 7 days. If fever of left arm redness, you need to seek immediate medical attention.  If you need refills for medications you take chronically, please call your pharmacy. Do not use My Chart to request refills or for acute issues that need immediate attention. If you send a my chart message, it may take a few days to be addressed, specially if I am not in the office.  Please be sure medication list is accurate. If a new problem present, please set up appointment sooner than planned today.

## 2021-12-31 NOTE — Progress Notes (Unsigned)
ACUTE VISIT Chief Complaint  Patient presents with   boil on finger    Left hand, thumb and top of hand.   HPI: [name] is a 61 y.o. right-handed female with medical hx significant for GERD,HTN,and asthma here today complaining of left hand tender,erythematous skin lesions. Painful "spot" on [name] right thumb noted yesterday. [name] reports that it felt like a splinter had gotten in there, no hx of trauma or yard/garden work. [name] woke up in the middle of the night due to the pain and noticed another "spot" on MCP 3rd finger and now having periungual erythema and edema 4th fingernail.  Throbbing like pain, constant, exacerbated by palpation or movement.  [name] denies any exposure to a specific irritant or substance that could have caused the issue. Lesion on 3rd MCP joint ,"burst" and drained spontaneously, pain has improved.  [name] has not tried OTC treatments. [name] denies any associated lesions in the mouth, fever, chills, numbness,tingling,or generalized myalgias.  [name] mentions having received a cortisone shot in [name] left shoulder a couple of weeks ago, which did not provide relief. [name] is scheduled for another cortisone injection with ultrasound guidance on Tuesday.  Review of Systems  Constitutional:  Negative for activity change and appetite change.  Respiratory:  Negative for cough and shortness of breath.   Gastrointestinal:  Negative for abdominal pain, diarrhea, nausea and vomiting.  Skin:  Positive for rash.  Neurological:  Negative for syncope, weakness and headaches.  See other pertinent positives and negatives in HPI.  Current Outpatient Medications on File Prior to Visit  Medication Sig Dispense Refill   ALPRAZolam (XANAX) 1 MG tablet TAKE (1/2) TO 1 TABLET BY MOUTH AT BEDTIME AS NEEDED FOR ANXIETY. 30 tablet 2   Cholecalciferol (VITAMIN D3) 125 MCG/ML LIQD Take 5,000 Units by mouth every 3 (three) days.     clotrimazole-betamethasone (LOTRISONE) cream Apply 1  Application topically daily as needed. 30 g 2   Tiotropium Bromide-Olodaterol (STIOLTO RESPIMAT) 2.5-2.5 MCG/ACT AERS Inhale 2 puffs into the lungs daily. 4 g 6   triamterene-hydrochlorothiazide (MAXZIDE) 75-50 MG tablet Take 0.5 tablets by mouth daily. 90 tablet 3   verapamil (VERELAN PM) 180 MG 24 hr capsule Take 1 capsule (180 mg total) by mouth at bedtime. 90 capsule 1   No current facility-administered medications on file prior to visit.    Past Medical History:  Diagnosis Date   Allergic rhinitis    Anxiety    Asthma    Chronic bronchitis    Constipation    Depression    GERD (gastroesophageal reflux disease)    History of eustachian tube dysfunction 09/14/2017   Hypertension    Migraines    Plantar fasciitis    PONV (postoperative nausea and vomiting)    Allergies  Allergen Reactions   Levaquin [Levofloxacin In D5w] Nausea Only    Social History   Socioeconomic History   Marital status: Single    Spouse name: Not on file   Number of children: 0   Years of education: Not on file   Highest education level: Not on file  Occupational History   Occupation: Surveyor, minerals    Comment: [employer]  Tobacco Use   Smoking status: Never   Smokeless tobacco: Never  Vaping Use   Vaping Use: Never used  Substance and Sexual Activity   Alcohol use: No   Drug use: No   Sexual activity: Never    Birth control/protection: Other-see comments  Other Topics Concern  Not on file  Social History Narrative   Single,lives alone.Part time transportation at [facility] in [place].   Social Determinants of Health   Financial Resource Strain: Not on file  Food Insecurity: Not on file  Transportation Needs: Not on file  Physical Activity: Not on file  Stress: Not on file  Social Connections: Not on file    Vitals:   12/31/21 1531  BP: 124/70  Pulse: 86  Resp: 16  Temp: 98.8 F (37.1 C)  SpO2: 97%   Body mass index is 24.72  kg/m.  Physical Exam Vitals and nursing note reviewed.  Constitutional:      General: [name] is not in acute distress.    Appearance: [name] is well-developed.  HENT:     Head: Normocephalic and atraumatic.  Eyes:     Conjunctiva/sclera: Conjunctivae normal.  Cardiovascular:     Rate and Rhythm: Normal rate and regular rhythm.  Pulmonary:     Effort: Pulmonary effort is normal. No respiratory distress.     Breath sounds: Normal breath sounds.  Musculoskeletal:       Hands:  Skin:    General: Skin is warm.     Findings: Erythema present. No rash.     Comments: See MS,hand.  Neurological:     Mental Status: [name] is alert and oriented to person, place, and time.   ASSESSMENT AND PLAN:  [name] is a 61 yo female seen today for left hand tender lesions.  Abscess of left hand X 2, worse on thinb fingertip. After verbal consent and discussion of risk, [name] agrees with having lesion drained. Area cleaned with alcohol several times, nerve block using plain Lidocaine 1% 2 ml total and  with a 18 gauge needle fluctuant area was pierced and purulent material drained, about 0.5 cc. [name] tolerated procedure well. Instructed to keep lesions clean with soap and water. Instructed about warning signs. Bactrim DS started today.  -     Sulfamethoxazole-Trimethoprim; Take 1 tablet by mouth 2 (two) times daily for 7 days.  Dispense: 14 tablet; Refill: 0  Paronychia of finger of left hand Bactrim DS bid x 7 d. Soaking hand and finger in warm water with epson salt will help.  -     Sulfamethoxazole-Trimethoprim; Take 1 tablet by mouth 2 (two) times daily for 7 days.  Dispense: 14 tablet; Refill: 0  Return if symptoms worsen or fail to improve, for keep next appointment.  Lasheika Ortloff G. Martinique, MD  [facility]. [place] office.

## 2022-01-01 NOTE — Addendum Note (Signed)
Addended by: [place], Matias Thurman G on: 01/01/2022 12:43 PM   Modules accepted: Level of Service

## 2022-01-04 ENCOUNTER — Encounter: Payer: Self-pay | Admitting: Orthopedic Surgery

## 2022-01-04 ENCOUNTER — Ambulatory Visit: Payer: BC Managed Care – PPO | Admitting: Orthopedic Surgery

## 2022-01-04 VITALS — BP 134/74 | HR 60 | Ht 66.0 in | Wt 153.0 lb

## 2022-01-04 DIAGNOSIS — M7582 Other shoulder lesions, left shoulder: Secondary | ICD-10-CM | POA: Diagnosis not present

## 2022-01-04 NOTE — Patient Instructions (Signed)

## 2022-01-04 NOTE — Progress Notes (Signed)
Return Patient Visit  Assessment: [name] is a 61 y.o. female with the following: 1. Rotator cuff tendonitis, left  Plan: [name] has pain in her left shoulder.  Prior subacromial steroid injection provided incomplete resolution of her symptoms.  She is interested in a glenohumeral joint injection, guided by ultrasound.  This was completed today in clinic.  She noted some immediate improvement in her range of motion.  Procedure note injection - Left shoulder, ultrasound guidance   Verbal consent was obtained to inject the Left shoulder, glenohumeral joint  Timeout was completed to confirm the site of injection.   Using the ultrasound, the rotator cuff tendons were identified.  The joint space was also identified. The skin was prepped with alcohol and ethyl chloride was sprayed at the injection site.  A 21-gauge needle was used to inject 40 mg of Depo-Medrol and 1% lidocaine (4 cc) into the glenohumeral joint space of the Left shoulder using a posterolateral approach.  The needle was visualized entering the glenohumeral joint, and the medication was also visualized. There were no complications.  A sterile bandage was applied.   Note: In order to accurately identify the placement of the needle, ultrasound was required, to increase the accuracy, and specificity of the injection.    Follow-up: Return if symptoms worsen or fail to improve.  Subjective:  Chief Complaint  Patient presents with   Follow-up    Recheck on left shoulder.    History of Present Illness: [name] is a 61 y.o. female who returns for evaluation of left shoulder pain.  I saw her in clinic about 6 weeks ago.  At that time, we completed a subacromial steroid injection.  She had some improvement in her symptoms, without complete resolution.  This lasted for about a month.  However, she continues to have difficulty with pain in the shoulder.  She continues to have limited range  of motion.  Review of Systems: No fevers or chills No numbness or tingling No chest pain No shortness of breath No bowel or bladder dysfunction No GI distress No headaches  Objective: BP 134/74   Pulse 60   Ht 5\' 6"  (1.676 m)   Wt 153 lb (69.4 kg)   BMI 24.69 kg/m   Physical Exam:  General: Alert and oriented. and No acute distress. Gait: Normal gait.  Left shoulder without deformity.  No atrophy is appreciated.  140 degrees of forward flexion until it is painful.  Internal rotation to the lumbar spine.  Limited external rotation, with pain.  Positive Neer's.  Negative belly press.  Fingers are warm and well-perfused.  Sensation intact throughout the left hand.  IMAGING: I personally ordered and reviewed the following images  No new imaging obtained today.  New Medications:  No orders of the defined types were placed in this encounter.     , MD  01/04/2022 10:31 PM

## 2022-01-04 NOTE — Telephone Encounter (Signed)
Pt seen today in clinic

## 2022-02-11 ENCOUNTER — Encounter: Payer: Self-pay | Admitting: Family Medicine

## 2022-02-11 DIAGNOSIS — I10 Essential (primary) hypertension: Secondary | ICD-10-CM

## 2022-02-11 DIAGNOSIS — F411 Generalized anxiety disorder: Secondary | ICD-10-CM

## 2022-02-11 MED ORDER — VERAPAMIL HCL ER 180 MG PO CP24: 180.0000 mg | ORAL_CAPSULE | Freq: Every day | ORAL | 2 refills | Status: DC

## 2022-02-11 MED ORDER — TRIAMTERENE-HCTZ 75-50 MG PO TABS: 0.5000 | ORAL_TABLET | Freq: Every day | ORAL | 2 refills | Status: DC

## 2022-02-14 MED ORDER — ALPRAZOLAM 1 MG PO TABS: ORAL_TABLET | ORAL | 2 refills | Status: DC

## 2022-02-17 ENCOUNTER — Other Ambulatory Visit: Payer: Self-pay | Admitting: Family Medicine

## 2022-02-17 DIAGNOSIS — Z1231 Encounter for screening mammogram for malignant neoplasm of breast: Secondary | ICD-10-CM

## 2022-03-04 NOTE — Progress Notes (Unsigned)
HPI: [name] is a 62 y.o. female, who is here today for chronic disease management.  Last seen on 12/31/21  She reports no new medications and no changes since the last visit.   Anxiety and insomnia: She is on Alprazolam 1 mg 1/2-1 mg daily, she takes medication at bedtime. She has difficulty falling asleep and going back to sleep is she gets up to urinate. Sleeps in average 7 hours.  HTN: She is on Verapamil 180 mg daily and Triamterene-HCTZ 75-50 mg 1/2 tab daily. Negative for severe/frequent headache, visual changes, chest pain, dyspnea, palpitation,focal weakness, or edema. Component     Latest Ref Rng 09/06/2021  ALT     0 - 32 IU/L   Glucose     70 - 99 mg/dL 81   BUN     6 - 23 mg/dL 19   Creatinine     0.40 - 1.20 mg/dL 1.01   BUN/Creatinine Ratio     12 - 28    Sodium     135 - 145 mEq/L 138   Potassium     3.5 - 5.1 mEq/L 4.3   Chloride     96 - 112 mEq/L 101   CO2     19 - 32 mEq/L 29   Calcium     8.4 - 10.5 mg/dL 9.9    Asthma: She has not had cough, DOE,or wheezing. She is on Stiolto 2.5-2.5 mcg, which she uses daily as needed, particularly when experiencing chest tightness due to weather changes.  HLD on non pharmacologic treatment. She is mindful of her diet, consuming whole grain bread, almond milk, eggs, salads, and lentils. Component     Latest Ref Rng 06/07/2021  Cholesterol, Total     100 - 199 mg/dL 220 (H)   Triglycerides     0.0 - 149.0 mg/dL 72   HDL Cholesterol     >39.00 mg/dL 110   VLDL Cholesterol Cal     5 - 40 mg/dL 12   Total CHOL/HDL Ratio 2.0   LDL Chol Calc (NIH)     0 - 99 mg/dL 98    Vit D deficiency: She takes vitamin D supplements, 5,000 units, twice a week, with increased intake during the winter months. The last vitamin D level check was in 03/2020, 25 OH vit D was 76 (29).  Review of Systems  Constitutional:  Negative for chills and fever.  HENT:  Negative for mouth sores and sore throat.    Gastrointestinal:  Negative for abdominal pain, nausea and vomiting.  Genitourinary:  Negative for decreased urine volume, dysuria and hematuria.  Skin:  Negative for rash.  Neurological:  Negative for syncope and facial asymmetry.  See other pertinent positives and negatives in HPI.  Current Outpatient Medications on File Prior to Visit  Medication Sig Dispense Refill   ALPRAZolam (XANAX) 1 MG tablet TAKE (1/2) TO 1 TABLET BY MOUTH AT BEDTIME AS NEEDED FOR ANXIETY. 30 tablet 2   Cholecalciferol (VITAMIN D3) 125 MCG/ML LIQD Take 5,000 Units by mouth every 3 (three) days.     clotrimazole-betamethasone (LOTRISONE) cream Apply 1 Application topically daily as needed. 30 g 2   Tiotropium Bromide-Olodaterol (STIOLTO RESPIMAT) 2.5-2.5 MCG/ACT AERS Inhale 2 puffs into the lungs daily. 4 g 6   triamterene-hydrochlorothiazide (MAXZIDE) 75-50 MG tablet Take 0.5 tablets by mouth daily. 90 tablet 2   verapamil (VERELAN PM) 180 MG 24 hr capsule Take 1 capsule (180 mg total) by mouth at bedtime.  90 capsule 2   No current facility-administered medications on file prior to visit.   Past Medical History:  Diagnosis Date   Allergic rhinitis    Anxiety    Asthma    Chronic bronchitis    Constipation    Depression    GERD (gastroesophageal reflux disease)    History of eustachian tube dysfunction 09/14/2017   Hypertension    Migraines    Plantar fasciitis    PONV (postoperative nausea and vomiting)    Allergies  Allergen Reactions   Levaquin [Levofloxacin In D5w] Nausea Only   Social History   Socioeconomic History   Marital status: Single    Spouse name: Not on file   Number of children: 0   Years of education: Not on file   Highest education level: Some college, no degree  Occupational History   Occupation: Scientist, forensic    Comment: Engineer, water  Tobacco Use   Smoking status: Never   Smokeless tobacco: Never  Vaping Use   Vaping Use: Never used  Substance and Sexual  Activity   Alcohol use: No   Drug use: No   Sexual activity: Never    Birth control/protection: Other-see comments  Other Topics Concern   Not on file  Social History Narrative   Single,lives alone.Part time transportation at [facility] in [place].   Social Determinants of Health   Financial Resource Strain: Not on file  Food Insecurity: Not on file  Transportation Needs: No Transportation Needs (03/07/2022)   PRAPARE - Hydrologist (Medical): No    Lack of Transportation (Non-Medical): No  Physical Activity: Sufficiently Active (03/07/2022)   Exercise Vital Sign    Days of Exercise per Week: 5 days    Minutes of Exercise per Session: 70 min  Stress: No Stress Concern Present (03/07/2022)   Levan    Feeling of Stress : Not at all  Social Connections: Unknown (03/07/2022)   Social Connection and Isolation Panel [NHANES]    Frequency of Communication with Friends and Family: Once a week    Frequency of Social Gatherings with Friends and Family: Not on file    Attends Religious Services: More than 4 times per year    Active Member of [employer] or Organizations: Yes    Attends Archivist Meetings: More than 4 times per year    Marital Status: Never married   Vitals:   03/07/22 0907  BP: 120/70  Pulse: 64  Resp: 16  Temp: 97.8 F (36.6 C)  SpO2: 98%   Body mass index is 24.92 kg/m.  Physical Exam Vitals and nursing note reviewed.  Constitutional:      General: She is not in acute distress.    Appearance: She is well-developed.  HENT:     Head: Normocephalic and atraumatic.     Mouth/Throat:     Mouth: Mucous membranes are moist.     Pharynx: Oropharynx is clear.  Eyes:     Conjunctiva/sclera: Conjunctivae normal.  Cardiovascular:     Rate and Rhythm: Normal rate and regular rhythm.     Pulses:          Dorsalis pedis pulses are 2+ on the right  side and 2+ on the left side.     Heart sounds: No murmur heard. Pulmonary:     Effort: Pulmonary effort is normal. No respiratory distress.     Breath sounds: Normal  breath sounds.  Abdominal:     Palpations: Abdomen is soft. There is no hepatomegaly or mass.     Tenderness: There is no abdominal tenderness.  Lymphadenopathy:     Cervical: No cervical adenopathy.  Skin:    General: Skin is warm.     Findings: No erythema or rash.  Neurological:     General: No focal deficit present.     Mental Status: She is alert and oriented to person, place, and time.     Cranial Nerves: No cranial nerve deficit.     Gait: Gait normal.  Psychiatric:     Comments: Well groomed, good eye contact.   ASSESSMENT AND PLAN:  Ms. Nyela was seen today for medical management of chronic issues.  Diagnoses and all orders for this visit: Lab Results  Component Value Date   CHOL 231 (H) 03/07/2022   HDL 105.80 03/07/2022   LDLCALC 110 (H) 03/07/2022   TRIG 73.0 03/07/2022   CHOLHDL 2 03/07/2022   Lab Results  Component Value Date   CREATININE 0.99 03/07/2022   BUN 15 03/07/2022   NA 136 03/07/2022   K 4.5 03/07/2022   CL 98 03/07/2022   CO2 28 03/07/2022   The ASCVD Risk score (Arnett DK, et al., 2019) failed to calculate for the following reasons:   The valid HDL cholesterol range is 20 to 100 mg/dL  Insomnia, unspecified type Assessment & Plan: Alprazolam 1 mg 0.5-1 tab at bedtime has helped, so no changes. Continue good sleep hygiene. PMP reviewed, Alprazolam last refill 02/14/22.   Mild intermittent asthma without complication Assessment & Plan: Problem is well controlled. Continue Stiolto inh 2.5-2.5 mcg daily as needed.   Essential hypertension, benign Assessment & Plan: BP adequately controlled. Continue verapamil 180 mg daily and triamterene-HCTZ 75-50 mg 1/2 tablet daily. Monitor BP regularly. Continue low-salt/DASH diet. Planning on arranging eye exam.  Orders: -      Basic metabolic panel; Future  Mixed hyperlipidemia Assessment & Plan: Non pharmacologic treatment to continue for now. Further recommendations will be given according to 10 years CVD risk score and lipid panel numbers.  Orders: -     Lipid panel; Future  Vitamin D insufficiency Assessment & Plan: Continue current dose of vit D supplementations. Further recommendations according to 25 OH vit D results.  Orders: -     VITAMIN D 25 Hydroxy (Vit-D Deficiency, Fractures); Future  Need for shingles vaccine -     Varicella-zoster vaccine IM  Return in about 6 months (around 09/05/2022) for CPE.  Nowell Sites G. Martinique, MD  [facility]. [place] office.

## 2022-03-07 ENCOUNTER — Ambulatory Visit: Payer: BC Managed Care – PPO | Admitting: Family Medicine

## 2022-03-07 VITALS — BP 120/70 | HR 64 | Temp 97.8°F | Resp 16 | Ht 66.0 in | Wt 154.4 lb

## 2022-03-07 DIAGNOSIS — E782 Mixed hyperlipidemia: Secondary | ICD-10-CM | POA: Diagnosis not present

## 2022-03-07 DIAGNOSIS — J4520 Mild intermittent asthma, uncomplicated: Secondary | ICD-10-CM | POA: Diagnosis not present

## 2022-03-07 DIAGNOSIS — I10 Essential (primary) hypertension: Secondary | ICD-10-CM | POA: Diagnosis not present

## 2022-03-07 DIAGNOSIS — G4700 Insomnia, unspecified: Secondary | ICD-10-CM

## 2022-03-07 DIAGNOSIS — E559 Vitamin D deficiency, unspecified: Secondary | ICD-10-CM

## 2022-03-07 DIAGNOSIS — Z23 Encounter for immunization: Secondary | ICD-10-CM | POA: Diagnosis not present

## 2022-03-07 LAB — LIPID PANEL
Cholesterol: 231 mg/dL — ABNORMAL HIGH (ref 0–200)
HDL: 105.8 mg/dL (ref >39.00)
LDL Cholesterol: 110 mg/dL — ABNORMAL HIGH (ref 0–99)
NonHDL: 124.9
Total CHOL/HDL Ratio: 2
Triglycerides: 73 mg/dL (ref 0.0–149.0)
VLDL: 14.6 mg/dL (ref 0.0–40.0)

## 2022-03-07 LAB — BASIC METABOLIC PANEL
BUN: 15 mg/dL (ref 6–23)
CO2: 28 mEq/L (ref 19–32)
Calcium: 10.3 mg/dL (ref 8.4–10.5)
Chloride: 98 mEq/L (ref 96–112)
Creatinine, Ser: 0.99 mg/dL (ref 0.40–1.20)
GFR: 60.86 mL/min (ref >60.00)
Glucose, Bld: 92 mg/dL (ref 70–99)
Potassium: 4.5 mEq/L (ref 3.5–5.1)
Sodium: 136 mEq/L (ref 135–145)

## 2022-03-07 LAB — VITAMIN D 25 HYDROXY (VIT D DEFICIENCY, FRACTURES): VITD: 50.39 ng/mL (ref 30.00–100.00)

## 2022-03-07 NOTE — Patient Instructions (Signed)
A few things to remember from today's visit:  Mild intermittent asthma without complication  Essential hypertension, benign - Plan: Basic metabolic panel  Insomnia, unspecified type  Mixed hyperlipidemia - Plan: Lipid panel  Vitamin D insufficiency - Plan: VITAMIN D 25 Hydroxy (Vit-D Deficiency, Fractures)  No changes today. I will see you in 6 months for your physical.  If you need refills for medications you take chronically, please call your pharmacy. Do not use My Chart to request refills or for acute issues that need immediate attention. If you send a my chart message, it may take a few days to be addressed, specially if I am not in the office.  Please be sure medication list is accurate. If a new problem present, please set up appointment sooner than planned today.

## 2022-03-07 NOTE — Assessment & Plan Note (Signed)
BP adequately controlled. Continue verapamil 180 mg daily and triamterene-HCTZ 75-50 mg 1/2 tablet daily. Monitor BP regularly. Continue low-salt/DASH diet. Planning on arranging eye exam.

## 2022-03-07 NOTE — Assessment & Plan Note (Signed)
Problem is well controlled. Continue Stiolto inh 2.5-2.5 mcg daily as needed.

## 2022-03-10 NOTE — Assessment & Plan Note (Addendum)
Alprazolam 1 mg 0.5-1 tab at bedtime has helped, so no changes. Continue good sleep hygiene. PMP reviewed, Alprazolam last refill 02/14/22.

## 2022-03-10 NOTE — Assessment & Plan Note (Signed)
Continue current dose of vit D supplementations. Further recommendations according to 25 [place] vit D results.

## 2022-03-10 NOTE — Assessment & Plan Note (Signed)
Non pharmacologic treatment to continue for now. Further recommendations will be given according to 10 years CVD risk score and lipid panel numbers.  

## 2022-03-31 ENCOUNTER — Encounter: Payer: Self-pay | Admitting: Radiology

## 2022-05-04 ENCOUNTER — Ambulatory Visit
Admission: RE | Admit: 2022-05-04 | Discharge: 2022-05-04 | Disposition: A | Discharge status: Home / Self Care | Payer: BC Managed Care – PPO | Source: Ambulatory Visit | Attending: Family Medicine | Admitting: Family Medicine

## 2022-05-04 DIAGNOSIS — Z1231 Encounter for screening mammogram for malignant neoplasm of breast: Secondary | ICD-10-CM

## 2022-07-09 ENCOUNTER — Encounter: Payer: Self-pay | Admitting: Family Medicine

## 2022-07-09 DIAGNOSIS — F411 Generalized anxiety disorder: Secondary | ICD-10-CM

## 2022-07-11 MED ORDER — ALPRAZOLAM 1 MG PO TABS: ORAL_TABLET | ORAL | 2 refills | Status: DC

## 2022-07-17 ENCOUNTER — Encounter: Payer: Self-pay | Admitting: Family Medicine

## 2022-09-02 NOTE — Progress Notes (Unsigned)
HPI: [name] is a 62 y.o. female, who is here today for her routine physical.  Last CPE: unsure  Regular exercise 3 or more time per week: *** Following a healthy diet: ***  Chronic medical problems: ***  Immunization History  Administered Date(s) Administered   Moderna SARS-COV2 Booster Vaccination 06/02/2020   Moderna Sars-Covid-2 Vaccination 02/04/2019, 03/07/2019, 12/17/2019   Td 12/10/2015   Zoster Recombinant(Shingrix) 09/06/2021, 03/07/2022   Health Maintenance  Topic Date Due   COVID-19 Vaccine (5 - 2023-24 season) 10/01/2021   INFLUENZA VACCINE  09/01/2022   MAMMOGRAM  05/03/2024   DTaP/Tdap/Td (2 - Tdap) 12/09/2025   Colonoscopy  12/15/2030   Hepatitis C Screening  Completed   HIV Screening  Completed   Zoster Vaccines- Shingrix  Completed   HPV VACCINES  Aged Out   PAP SMEAR-Modifier  Discontinued    She has *** concerns today.  Review of Systems  Current Outpatient Medications on File Prior to Visit  Medication Sig Dispense Refill   ALPRAZolam (XANAX) 1 MG tablet TAKE (1/2) TO 1 TABLET BY MOUTH AT BEDTIME AS NEEDED FOR ANXIETY. 30 tablet 2   Cholecalciferol (VITAMIN D3) 125 MCG/ML LIQD Take 5,000 Units by mouth every 3 (three) days.     clotrimazole-betamethasone (LOTRISONE) cream Apply 1 Application topically daily as needed. 30 g 2   Tiotropium Bromide-Olodaterol (STIOLTO RESPIMAT) 2.5-2.5 MCG/ACT AERS Inhale 2 puffs into the lungs daily. 4 g 6   triamterene-hydrochlorothiazide (MAXZIDE) 75-50 MG tablet Take 0.5 tablets by mouth daily. 90 tablet 2   verapamil (VERELAN PM) 180 MG 24 hr capsule Take 1 capsule (180 mg total) by mouth at bedtime. 90 capsule 2   No current facility-administered medications on file prior to visit.    Past Medical History:  Diagnosis Date   Allergic rhinitis    Anxiety    Asthma    Chronic bronchitis    Constipation    Depression    GERD (gastroesophageal reflux disease)    History of eustachian tube  dysfunction 09/14/2017   Hypertension    Migraines    Plantar fasciitis    PONV (postoperative nausea and vomiting)     Past Surgical History:  Procedure Laterality Date   COLONOSCOPY  MAY 2012 ARS   SLF: NSAID COLON ULCERS, SML IH   COLONOSCOPY WITH PROPOFOL N/A 12/14/2020   Procedure: COLONOSCOPY WITH PROPOFOL;  Surgeon: Lanelle Bal, DO;  Location: [facility];  Service: Endoscopy;  Laterality: N/A;  7:30am   KNEE ARTHROSCOPY WITH MEDIAL MENISECTOMY Left 10/05/2012   Procedure: LEFT KNEE ARTHROSCOPY WITH MEDIAL MENISECTOMY;  Surgeon: Vickki Hearing, MD;  Location: [facility];  Service: Orthopedics;  Laterality: Left;   MYRINGOTOMY     NASAL SINUS SURGERY     right foot     plantar fascitis   TONSILLECTOMY     UPPER GASTROINTESTINAL ENDOSCOPY  MAY 2012 DYSPHAGIA   [id number], ESO STR-SAV DIL 15 MM   UPPER GASTROINTESTINAL ENDOSCOPY  MAY 2012   SAV DIL , next one w/ propofol    Allergies  Allergen Reactions   Levaquin [Levofloxacin In D5w] Nausea Only    Family History  Problem Relation Age of Onset   Lung cancer Mother        smoker   Hypertension Mother    Heart failure Father    Colon polyps Neg Hx    Crohn's disease Neg Hx    Breast cancer Neg Hx     Social History  Socioeconomic History   Marital status: Single    Spouse name: Not on file   Number of children: 0   Years of education: Not on file   Highest education level: Some college, no degree  Occupational History   Occupation: Surveyor, minerals    Comment: Engineer, maintenance (IT)  Tobacco Use   Smoking status: Never   Smokeless tobacco: Never  Vaping Use   Vaping status: Never Used  Substance and Sexual Activity   Alcohol use: No   Drug use: No   Sexual activity: Never    Birth control/protection: Other-see comments  Other Topics Concern   Not on file  Social History Narrative   Single,lives alone.Part time transportation at [facility] in [place].    Social Determinants of Health   Financial Resource Strain: Not on file  Food Insecurity: Not on file  Transportation Needs: No Transportation Needs (03/07/2022)   PRAPARE - Administrator, Civil Service (Medical): No    Lack of Transportation (Non-Medical): No  Physical Activity: Sufficiently Active (03/07/2022)   Exercise Vital Sign    Days of Exercise per Week: 5 days    Minutes of Exercise per Session: 70 min  Stress: No Stress Concern Present (03/07/2022)   [employer] of Occupational Health - Occupational Stress Questionnaire    Feeling of Stress : Not at all  Social Connections: Unknown (03/07/2022)   Social Connection and Isolation Panel [NHANES]    Frequency of Communication with Friends and Family: Once a week    Frequency of Social Gatherings with Friends and Family: Not on file    Attends Religious Services: More than 4 times per year    Active Member of [employer] or Organizations: Yes    Attends Banker Meetings: More than 4 times per year    Marital Status: Never married    There were no vitals filed for this visit. There is no height or weight on file to calculate BMI.  Wt Readings from Last 3 Encounters:  03/07/22 154 lb 6 oz (70 kg)  01/04/22 153 lb (69.4 kg)  12/31/21 153 lb 2 oz (69.5 kg)    Physical Exam  ASSESSMENT AND PLAN: [name] was here today annual physical examination.  No orders of the defined types were placed in this encounter.   There are no diagnoses linked to this encounter.  There are no diagnoses linked to this encounter.  No follow-ups on file.  [name] G. [place], MD  [facility]. [place] office.

## 2022-09-05 ENCOUNTER — Encounter: Payer: Self-pay | Admitting: Family Medicine

## 2022-09-05 ENCOUNTER — Ambulatory Visit (INDEPENDENT_AMBULATORY_CARE_PROVIDER_SITE_OTHER): Payer: BC Managed Care – PPO | Admitting: Family Medicine

## 2022-09-05 VITALS — BP 126/80 | HR 60 | Temp 98.2°F | Resp 16 | Ht 66.0 in | Wt 150.0 lb

## 2022-09-05 DIAGNOSIS — I10 Essential (primary) hypertension: Secondary | ICD-10-CM | POA: Diagnosis not present

## 2022-09-05 DIAGNOSIS — J45901 Unspecified asthma with (acute) exacerbation: Secondary | ICD-10-CM

## 2022-09-05 DIAGNOSIS — Z0000 Encounter for general adult medical examination without abnormal findings: Secondary | ICD-10-CM

## 2022-09-05 DIAGNOSIS — E782 Mixed hyperlipidemia: Secondary | ICD-10-CM | POA: Diagnosis not present

## 2022-09-05 DIAGNOSIS — Z131 Encounter for screening for diabetes mellitus: Secondary | ICD-10-CM

## 2022-09-05 LAB — LIPID PANEL
Cholesterol: 228 mg/dL — ABNORMAL HIGH (ref 0–200)
HDL: 105.9 mg/dL (ref >39.00)
LDL Cholesterol: 112 mg/dL — ABNORMAL HIGH (ref 0–99)
NonHDL: 121.84
Total CHOL/HDL Ratio: 2
Triglycerides: 50 mg/dL (ref 0.0–149.0)
VLDL: 10 mg/dL (ref 0.0–40.0)

## 2022-09-05 LAB — COMPREHENSIVE METABOLIC PANEL
ALT: 15 U/L (ref 0–35)
AST: 24 U/L (ref 0–37)
Albumin: 4.2 g/dL (ref 3.5–5.2)
Alkaline Phosphatase: 91 U/L (ref 39–117)
BUN: 10 mg/dL (ref 6–23)
CO2: 28 mEq/L (ref 19–32)
Calcium: 9.7 mg/dL (ref 8.4–10.5)
Chloride: 93 mEq/L — ABNORMAL LOW (ref 96–112)
Creatinine, Ser: 0.96 mg/dL (ref 0.40–1.20)
GFR: 62.93 mL/min (ref >60.00)
Glucose, Bld: 89 mg/dL (ref 70–99)
Potassium: 4 mEq/L (ref 3.5–5.1)
Sodium: 129 mEq/L — ABNORMAL LOW (ref 135–145)
Total Bilirubin: 0.5 mg/dL (ref 0.2–1.2)
Total Protein: 7.6 g/dL (ref 6.0–8.3)

## 2022-09-05 LAB — HEMOGLOBIN A1C: Hgb A1c MFr Bld: 5.7 % (ref 4.6–6.5)

## 2022-09-05 MED ORDER — PREDNISONE 20 MG PO TABS: 40.0000 mg | ORAL_TABLET | Freq: Every day | ORAL | 0 refills | Status: AC

## 2022-09-05 NOTE — Patient Instructions (Addendum)
A few things to remember from today's visit:  Routine general medical examination at a health care facility  Essential hypertension, benign - Plan: Comprehensive metabolic panel  Mixed hyperlipidemia - Plan: Comprehensive metabolic panel, Lipid panel  Mild asthma with exacerbation, unspecified whether persistent - Plan: predniSONE (DELTASONE) 20 MG tablet  Screening for diabetes mellitus - Plan: Hemoglobin A1c  Prednisone with breakfast for 3-5 days. Plain mucinex as needed.  If you need refills for medications you take chronically, please call your pharmacy. Do not use My Chart to request refills or for acute issues that need immediate attention. If you send a my chart message, it may take a few days to be addressed, specially if I am not in the office.  Please be sure medication list is accurate. If a new problem present, please set up appointment sooner than planned today.  Health Maintenance, Female Adopting a healthy lifestyle and getting preventive care are important in promoting health and wellness. Ask your health care provider about: The right schedule for you to have regular tests and exams. Things you can do on your own to prevent diseases and keep yourself healthy. What should I know about diet, weight, and exercise? Eat a healthy diet  Eat a diet that includes plenty of vegetables, fruits, low-fat dairy products, and lean protein. Do not eat a lot of foods that are high in solid fats, added sugars, or sodium. Maintain a healthy weight Body mass index (BMI) is used to identify weight problems. It estimates body fat based on height and weight. Your health care provider can help determine your BMI and help you achieve or maintain a healthy weight. Get regular exercise Get regular exercise. This is one of the most important things you can do for your health. Most adults should: Exercise for at least 150 minutes each week. The exercise should increase your heart rate and  make you sweat (moderate-intensity exercise). Do strengthening exercises at least twice a week. This is in addition to the moderate-intensity exercise. Spend less time sitting. Even light physical activity can be beneficial. Watch cholesterol and blood lipids Have your blood tested for lipids and cholesterol at 63 years of age, then have this test every 5 years. Have your cholesterol levels checked more often if: Your lipid or cholesterol levels are high. You are older than 63 years of age. You are at high risk for heart disease. What should I know about cancer screening? Depending on your health history and family history, you may need to have cancer screening at various ages. This may include screening for: Breast cancer. Cervical cancer. Colorectal cancer. Skin cancer. Lung cancer. What should I know about heart disease, diabetes, and high blood pressure? Blood pressure and heart disease High blood pressure causes heart disease and increases the risk of stroke. This is more likely to develop in people who have high blood pressure readings or are overweight. Have your blood pressure checked: Every 3-5 years if you are 4-51 years of age. Every year if you are 97 years old or older. Diabetes Have regular diabetes screenings. This checks your fasting blood sugar level. Have the screening done: Once every three years after age 74 if you are at a normal weight and have a low risk for diabetes. More often and at a younger age if you are overweight or have a high risk for diabetes. What should I know about preventing infection? Hepatitis B If you have a higher risk for hepatitis B, you should be screened  for this virus. Talk with your health care provider to find out if you are at risk for hepatitis B infection. Hepatitis C Testing is recommended for: Everyone born from [birth year] through 1966. Anyone with known risk factors for hepatitis C. Sexually transmitted infections (STIs) Get screened  for STIs, including gonorrhea and chlamydia, if: You are sexually active and are younger than 63 years of age. You are older than 63 years of age and your health care provider tells you that you are at risk for this type of infection. Your sexual activity has changed since you were last screened, and you are at increased risk for chlamydia or gonorrhea. Ask your health care provider if you are at risk. Ask your health care provider about whether you are at high risk for HIV. Your health care provider may recommend a prescription medicine to help prevent HIV infection. If you choose to take medicine to prevent HIV, you should first get tested for HIV. You should then be tested every 3 months for as long as you are taking the medicine. Pregnancy If you are about to stop having your period (premenopausal) and you may become pregnant, seek counseling before you get pregnant. Take 400 to 800 micrograms (mcg) of folic acid every day if you become pregnant. Ask for birth control (contraception) if you want to prevent pregnancy. Osteoporosis and menopause Osteoporosis is a disease in which the bones lose minerals and strength with aging. This can result in bone fractures. If you are 7 years old or older, or if you are at risk for osteoporosis and fractures, ask your health care provider if you should: Be screened for bone loss. Take a calcium or vitamin D supplement to lower your risk of fractures. Be given hormone replacement therapy (HRT) to treat symptoms of menopause. Follow these instructions at home: Alcohol use Do not drink alcohol if: Your health care provider tells you not to drink. You are pregnant, may be pregnant, or are planning to become pregnant. If you drink alcohol: Limit how much you have to: 0-1 drink a day. Know how much alcohol is in your drink. In the [place] one drink equals one 12 oz bottle of beer (355 mL), one 5 oz glass of wine (148 mL), or one 1 oz glass of hard liquor (44  mL). Lifestyle Do not use any products that contain nicotine or tobacco. These products include cigarettes, chewing tobacco, and vaping devices, such as e-cigarettes. If you need help quitting, ask your health care provider. Do not use street drugs. Do not share needles. Ask your health care provider for help if you need support or information about quitting drugs. General instructions Schedule regular health, dental, and eye exams. Stay current with your vaccines. Tell your health care provider if: You often feel depressed. You have ever been abused or do not feel safe at home. Summary Adopting a healthy lifestyle and getting preventive care are important in promoting health and wellness. Follow your health care provider's instructions about healthy diet, exercising, and getting tested or screened for diseases. Follow your health care provider's instructions on monitoring your cholesterol and blood pressure. This information is not intended to replace advice given to you by your health care provider. Make sure you discuss any questions you have with your health care provider. Document Revised: 06/08/2020 Document Reviewed: 06/08/2020 Elsevier Patient Education  2024 [employer].

## 2022-09-08 NOTE — Assessment & Plan Note (Signed)
BP adequately controlled. Continue verapamil 180 mg daily and triamterene-HCTZ 75-50 mg 1/2 tablet daily as well as low salt diet. Eye exam is current.

## 2022-09-08 NOTE — Assessment & Plan Note (Signed)
Continue nonpharmacologic treatment. Further recommendations will be given according to 10 years CVD risk score and lipid panel numbers. 

## 2022-09-21 ENCOUNTER — Encounter: Payer: Self-pay | Admitting: Family Medicine

## 2022-09-21 NOTE — Telephone Encounter (Signed)
Which inhaler did you want to try?

## 2022-09-27 MED ORDER — TRELEGY ELLIPTA 100-62.5-25 MCG/ACT IN AEPB: 1.0000 | INHALATION_SPRAY | Freq: Every day | RESPIRATORY_TRACT | 3 refills | Status: DC

## 2022-10-13 ENCOUNTER — Encounter: Payer: Self-pay | Admitting: Family Medicine

## 2022-10-13 DIAGNOSIS — I10 Essential (primary) hypertension: Secondary | ICD-10-CM

## 2022-10-14 MED ORDER — VERAPAMIL HCL ER 180 MG PO CP24
180.0000 mg | ORAL_CAPSULE | Freq: Every day | ORAL | 2 refills | Status: DC
Start: 2022-10-14 — End: 2023-05-22

## 2022-10-21 ENCOUNTER — Other Ambulatory Visit: Payer: Self-pay | Admitting: Family Medicine

## 2022-10-21 DIAGNOSIS — F411 Generalized anxiety disorder: Secondary | ICD-10-CM

## 2022-10-21 MED ORDER — ALPRAZOLAM 1 MG PO TABS
ORAL_TABLET | ORAL | 3 refills | Status: DC
Start: 2022-10-21 — End: 2023-03-03

## 2023-03-03 ENCOUNTER — Other Ambulatory Visit: Payer: Self-pay | Admitting: Family Medicine

## 2023-03-03 DIAGNOSIS — F411 Generalized anxiety disorder: Secondary | ICD-10-CM

## 2023-03-07 NOTE — Progress Notes (Signed)
 HPI: [name] is a 63 y.o. female with a PMHx significant for HTN, asthma, GERD, sensorineural hearing loss,vitamin D  deficiency, HLD, insomnia, and anxiety here today for chronic disease management.  Last seen on 09/05/2022  Hypertension: Currently on triamterene -hydrochlorothiazide 75-50 mg 1/2 tab daily.  BP readings at home: She has not been checking her BP at home.  Negative for unusual or severe headache, visual changes, exertional chest pain, dyspnea,  focal weakness, or edema.  Lab Results  Component Value Date   CREATININE 0.96 09/05/2022   BUN 10 09/05/2022   NA 129 (L) 09/05/2022   K 4.0 09/05/2022   CL 93 (L) 09/05/2022   CO2 28 09/05/2022   Asthma:  Currently on Trelegy Ellipta  100-62.5-25 mcg/act AEPB.  She still has her chronic cough. Negative for DOE or wheezing. She says she is able to exercise without limitations.   Anxiety and insomnia:  Currently on Alprazolam  1 mg at bedtime prn.  She has been taking medication for years, it has been well-tolerated. Negative for depression like symptoms. She is sleeping ~7 hours per night.   Concerns today:   She mentions she has been having intermittent episodes of nausea, bloating sensation, burping, and stool leakage for about 6 months.  When she has the stool leakage, she also has some rectal itching. She normally has bowel movements once per day, and doesn't feel that she has emptied everything out.   She drinks Kombucha,and thinks that helps with the bloating.  Pertinent negatives include blood in her stool, straining during defecation, hard stools, dyschezia, urinary incontinence, or heartburn.  GERD: She is not on pharmacologic treatment.  Takes OTC medication as needed for heartburn. Negative for dysphagia.  HLD: Currently on nonpharmacologic treatment. She eats fruits and vegetables, beans, and some red meats.   Component     Latest Ref Rng 09/05/2022  Triglycerides     0.0 - 149.0 mg/dL 49.9    HDL Cholesterol     >39.00 mg/dL 894.09   Total CHOL/HDL Ratio 2   Cholesterol     0 - 200 mg/dL 771 (H)   VLDL     0.0 - 40.0 mg/dL 89.9   LDL (calc)     0 - 99 mg/dL 887 (H)   NonHDL 878.15     Review of Systems  Constitutional:  Negative for activity change, appetite change and fever.  HENT:  Negative for mouth sores, nosebleeds and sore throat.   Respiratory:  Negative for chest tightness and stridor.   Gastrointestinal:  Negative for abdominal pain and vomiting.       Negative for changes in bowel habits.  Endocrine: Negative for cold intolerance and heat intolerance.  Genitourinary:  Negative for decreased urine volume, dysuria and hematuria.  Musculoskeletal:  Negative for myalgias.  Allergic/Immunologic: Positive for environmental allergies.  Neurological:  Negative for syncope and facial asymmetry.  See other pertinent positives and negatives in HPI.  Current Outpatient Medications on File Prior to Visit  Medication Sig Dispense Refill   ALPRAZolam  (XANAX ) 1 MG tablet TAKE (1/2) TO 1 TABLET BY MOUTH AT BEDTIME AS NEEDED FOR ANXIETY. 30 tablet 3   Cholecalciferol (VITAMIN D3) 125 MCG/ML LIQD Take 5,000 Units by mouth every 3 (three) days.     clotrimazole -betamethasone  (LOTRISONE ) cream Apply 1 Application topically daily as needed. 30 g 2   Fluticasone -Umeclidin-Vilant (TRELEGY ELLIPTA ) 100-62.5-25 MCG/ACT AEPB Inhale 1 puff into the lungs daily. 1 each 3   triamterene -hydrochlorothiazide (MAXZIDE) 75-50 MG tablet Take  0.5 tablets by mouth daily. 90 tablet 2   verapamil  (VERELAN ) 180 MG 24 hr capsule Take 1 capsule (180 mg total) by mouth at bedtime. 90 capsule 2   No current facility-administered medications on file prior to visit.   Past Medical History:  Diagnosis Date   Allergic rhinitis    Anxiety    Asthma    Chronic bronchitis    Constipation    Depression    GERD (gastroesophageal reflux disease)    History of eustachian tube dysfunction 09/14/2017    Hypertension    Migraines    Plantar fasciitis    PONV (postoperative nausea and vomiting)    Allergies  Allergen Reactions   Levaquin [Levofloxacin In D5w] Nausea Only    Social History   Socioeconomic History   Marital status: Single    Spouse name: Not on file   Number of children: 0   Years of education: Not on file   Highest education level: Some college, no degree  Occupational History   Occupation: surveyor, minerals    Comment: Engineer, Maintenance (it)  Tobacco Use   Smoking status: Never   Smokeless tobacco: Never  Vaping Use   Vaping status: Never Used  Substance and Sexual Activity   Alcohol use: No   Drug use: No   Sexual activity: Never    Birth control/protection: Other-see comments  Other Topics Concern   Not on file  Social History Narrative   Single,lives alone.Part time transportation at [facility] in [place].   Social Drivers of Corporate Investment Banker Strain: Not on file  Food Insecurity: Not on file  Transportation Needs: No Transportation Needs (03/07/2022)   PRAPARE - Administrator, Civil Service (Medical): No    Lack of Transportation (Non-Medical): No  Physical Activity: Sufficiently Active (03/07/2022)   Exercise Vital Sign    Days of Exercise per Week: 5 days    Minutes of Exercise per Session: 70 min  Stress: No Stress Concern Present (03/07/2022)   [employer] of Occupational Health - Occupational Stress Questionnaire    Feeling of Stress : Not at all  Social Connections: Unknown (03/07/2022)   Social Connection and Isolation Panel [NHANES]    Frequency of Communication with Friends and Family: Once a week    Frequency of Social Gatherings with Friends and Family: Not on file    Attends Religious Services: More than 4 times per year    Active Member of [employer] or Organizations: Yes    Attends Banker Meetings: More than 4 times per year    Marital Status: Never married    Today's  Vitals   03/08/23 0820  BP: 136/80  Pulse: 76  Resp: 16  SpO2: 95%  Weight: 153 lb (69.4 kg)  Height: 5' 6 (1.676 m)   Body mass index is 24.69 kg/m.  Physical Exam Vitals and nursing note reviewed.  Constitutional:      General: She is not in acute distress.    Appearance: She is well-developed.  HENT:     Head: Normocephalic and atraumatic.     Mouth/Throat:     Mouth: Mucous membranes are moist.     Pharynx: Oropharynx is clear.  Eyes:     Conjunctiva/sclera: Conjunctivae normal.  Cardiovascular:     Rate and Rhythm: Normal rate and regular rhythm.     Pulses:          Posterior tibial pulses are 2+ on the right  side and 2+ on the left side.     Heart sounds: No murmur heard. Pulmonary:     Effort: Pulmonary effort is normal. No respiratory distress.     Breath sounds: Normal breath sounds.  Abdominal:     Palpations: Abdomen is soft. There is no hepatomegaly or mass.     Tenderness: There is no abdominal tenderness.  Lymphadenopathy:     Cervical: No cervical adenopathy.  Skin:    General: Skin is warm.     Findings: No erythema or rash.  Neurological:     General: No focal deficit present.     Mental Status: She is alert and oriented to person, place, and time.     Cranial Nerves: No cranial nerve deficit.     Gait: Gait normal.  Psychiatric:        Mood and Affect: Mood and affect normal.   ASSESSMENT AND PLAN:  Ms. Brass was seen today for chronic disease management.   Orders Placed This Encounter  Procedures   Basic metabolic panel   Lipid panel   TSH   Lab Results  Component Value Date   TSH 1.76 03/08/2023   Lab Results  Component Value Date   NA 138 03/08/2023   CL 101 03/08/2023   K 4.3 03/08/2023   CO2 28 03/08/2023   BUN 13 03/08/2023   CREATININE 0.86 03/08/2023   GFR 71.56 03/08/2023   CALCIUM 9.7 03/08/2023   ALBUMIN 4.2 09/05/2022   GLUCOSE 90 03/08/2023   Lab Results  Component Value Date   CHOL 221 (H) 03/08/2023    HDL 96.00 03/08/2023   LDLCALC 112 (H) 03/08/2023   TRIG 65.0 03/08/2023   CHOLHDL 2 03/08/2023   Dyspepsia History of GERD, she is not having heartburn or dysphagia. Nausea, bloating sensation, and burping could be related with this problem. Recommend trial of omeprazole  40 mg 30-minute before breakfast for 6 to 8 weeks. Continue GERD precautions. -     Omeprazole ; Take 1 capsule (40 mg total) by mouth daily.  Dispense: 30 capsule; Refill: 1  Insomnia, unspecified type Assessment & Plan: Problem is stable. Continue alprazolam  1 mg 0.5-1 tab at bedtime, which has helped. Continue good sleep hygiene. PMP reviewed. Follow-up in 6 months.  Orders: -     TSH; Future  Essential hypertension, benign Assessment & Plan: BP mildly elevated today. Recommend monitoring BP at home, goal < 130/80. For now continue triamterene -HCTZ 75-50 mg 1/2 tablet daily and verapamil  180 mg daily. Continue low-salt diet. Eye exam is current.  Orders: -     TSH; Future  Hyponatremia Sodium was 129 in 09/2022. Some of her medication could be contributing factors. Further recommendation will be given according to BMP result.  -     Basic metabolic panel; Future  Mixed hyperlipidemia Assessment & Plan: Continue nonpharmacologic treatment. She would like to have lipid panel checked today. Further recommendation will be given according to lab results.  Orders: -     Lipid panel; Future  Nausea without vomiting Possible causes discussed, it could be caused by dyspepsia and aggravated by bloating sensation and constipation. Monitor for new symptoms. Omeprazole  40 mg started today.  Incomplete defecation C/O fecal leakage, no other associated symptom. Continue adequate fiber and fluid intake. Last colonoscopy in 12/2020: Many small and large mouth diverticula in sigmoid and descending colon.  Moderate amount of stool throughout the entire colon and nonbleeding internal hemorrhoids. I do not  think further workup is needed at this  time. Recommend OTC MiraLAX and bisacodyl 5 mg daily at bedtime, if diarrhea she can do it every other day. If fecal leakage is persistent after improving defecation, we can consider GI consultation. -     TSH; Future  GAD (generalized anxiety disorder) Assessment & Plan: Problem is stable overall. Continue alprazolam  1 mg 1/2 to 1 tablet daily as needed.  Mild intermittent asthma without complication Assessment & Plan: Problem is well controlled. Continue Trelegy Ellipta  100-62.5-25 mcg/act 1 puff daily.  I spent a total of 41 minutes in both face to face and non face to face activities for this visit on the date of this encounter. During this time history was obtained and documented, examination was performed, prior labs/imaging reviewed, and assessment/plan discussed.  Return in about 6 months (around 09/05/2023) for CPE.  I, Leonce PARAS Wierda, acting as a scribe for Akeema Broder, MD., have documented all relevant documentation on the behalf of Kenwood Rosiak, MD, as directed by  Seon Gaertner, MD while in the presence of Britani Beattie, MD.   I, Tannisha Kennington, MD, have reviewed all documentation for this visit. The documentation on 03/08/23 for the exam, diagnosis, procedures, and orders are all accurate and complete.  Nilan Iddings G. Alynna Hargrove, MD  [facility]. [place] office.

## 2023-03-08 ENCOUNTER — Encounter: Payer: Self-pay | Admitting: Family Medicine

## 2023-03-08 ENCOUNTER — Ambulatory Visit: Payer: Self-pay | Admitting: Family Medicine

## 2023-03-08 VITALS — BP 136/80 | HR 76 | Resp 16 | Ht 66.0 in | Wt 153.0 lb

## 2023-03-08 DIAGNOSIS — G4700 Insomnia, unspecified: Secondary | ICD-10-CM

## 2023-03-08 DIAGNOSIS — R1013 Epigastric pain: Secondary | ICD-10-CM | POA: Diagnosis not present

## 2023-03-08 DIAGNOSIS — E871 Hypo-osmolality and hyponatremia: Secondary | ICD-10-CM | POA: Diagnosis not present

## 2023-03-08 DIAGNOSIS — E782 Mixed hyperlipidemia: Secondary | ICD-10-CM

## 2023-03-08 DIAGNOSIS — R150 Incomplete defecation: Secondary | ICD-10-CM | POA: Diagnosis not present

## 2023-03-08 DIAGNOSIS — F411 Generalized anxiety disorder: Secondary | ICD-10-CM

## 2023-03-08 DIAGNOSIS — J4520 Mild intermittent asthma, uncomplicated: Secondary | ICD-10-CM

## 2023-03-08 DIAGNOSIS — I10 Essential (primary) hypertension: Secondary | ICD-10-CM | POA: Diagnosis not present

## 2023-03-08 DIAGNOSIS — R110 Nausea: Secondary | ICD-10-CM

## 2023-03-08 LAB — LIPID PANEL
Cholesterol: 221 mg/dL — ABNORMAL HIGH (ref 0–200)
HDL: 96 mg/dL (ref >39.00)
LDL Cholesterol: 112 mg/dL — ABNORMAL HIGH (ref 0–99)
NonHDL: 124.72
Total CHOL/HDL Ratio: 2
Triglycerides: 65 mg/dL (ref 0.0–149.0)
VLDL: 13 mg/dL (ref 0.0–40.0)

## 2023-03-08 LAB — BASIC METABOLIC PANEL
BUN: 13 mg/dL (ref 6–23)
CO2: 28 meq/L (ref 19–32)
Calcium: 9.7 mg/dL (ref 8.4–10.5)
Chloride: 101 meq/L (ref 96–112)
Creatinine, Ser: 0.86 mg/dL (ref 0.40–1.20)
GFR: 71.56 mL/min (ref >60.00)
Glucose, Bld: 90 mg/dL (ref 70–99)
Potassium: 4.3 meq/L (ref 3.5–5.1)
Sodium: 138 meq/L (ref 135–145)

## 2023-03-08 LAB — TSH: TSH: 1.76 u[IU]/mL (ref 0.35–5.50)

## 2023-03-08 MED ORDER — OMEPRAZOLE 40 MG PO CPDR
40.0000 mg | DELAYED_RELEASE_CAPSULE | Freq: Every day | ORAL | 1 refills | Status: DC
Start: 2023-03-08 — End: 2023-05-10

## 2023-03-08 NOTE — Assessment & Plan Note (Signed)
 Problem is well controlled. Continue Trelegy Ellipta  100-62.5-25 mcg/act 1 puff daily.

## 2023-03-08 NOTE — Assessment & Plan Note (Signed)
 Problem is stable. Continue alprazolam  1 mg 0.5-1 tab at bedtime, which has helped. Continue good sleep hygiene. PMP reviewed. Follow-up in 6 months.

## 2023-03-08 NOTE — Assessment & Plan Note (Signed)
 Problem is stable overall. Continue alprazolam  1 mg 1/2 to 1 tablet daily as needed.

## 2023-03-08 NOTE — Assessment & Plan Note (Signed)
 BP mildly elevated today. Recommend monitoring BP at home, goal < 130/80. For now continue triamterene -HCTZ 75-50 mg 1/2 tablet daily and verapamil  180 mg daily. Continue low-salt diet. Eye exam is current.

## 2023-03-08 NOTE — Patient Instructions (Addendum)
 A few things to remember from today's visit:  Essential hypertension, benign  GAD (generalized anxiety disorder)  Insomnia, unspecified type  Hyponatremia - Plan: Basic metabolic panel  Mixed hyperlipidemia - Plan: Lipid panel  Nausea without vomiting  Incomplete defecation  Dyspepsia - Plan: omeprazole  (PRILOSEC) 40 MG capsule  Start Miralax and Bisacodyl 5 mg daily at bedtime, if diarrhea take meds every other day. Adequate amount of fiber. Omeprazole  30 min before breakfast for 6-8 weeks. No changes in rest.  Monitor blood rpessure, goal under 130/80.  If you need refills for medications you take chronically, please call your pharmacy. Do not use My Chart to request refills or for acute issues that need immediate attention. If you send a my chart message, it may take a few days to be addressed, specially if I am not in the office.  Please be sure medication list is accurate. If a new problem present, please set up appointment sooner than planned today.

## 2023-03-08 NOTE — Assessment & Plan Note (Addendum)
 Continue nonpharmacologic treatment. She would like to have lipid panel checked today. Further recommendation will be given according to lab results.

## 2023-04-13 ENCOUNTER — Encounter: Payer: Self-pay | Admitting: Internal Medicine

## 2023-04-13 ENCOUNTER — Ambulatory Visit (INDEPENDENT_AMBULATORY_CARE_PROVIDER_SITE_OTHER): Payer: 59 | Admitting: Internal Medicine

## 2023-04-13 VITALS — BP 159/81 | HR 58 | Temp 98.6°F | Ht 66.0 in | Wt 153.8 lb

## 2023-04-13 DIAGNOSIS — R151 Fecal smearing: Secondary | ICD-10-CM

## 2023-04-13 DIAGNOSIS — R1032 Left lower quadrant pain: Secondary | ICD-10-CM

## 2023-04-13 DIAGNOSIS — K5904 Chronic idiopathic constipation: Secondary | ICD-10-CM

## 2023-04-13 DIAGNOSIS — K5900 Constipation, unspecified: Secondary | ICD-10-CM | POA: Diagnosis not present

## 2023-04-13 NOTE — Patient Instructions (Signed)
 I am going to give you samples of a medication called Linzess 72 mcg daily for your constipation.  We have room to increase dose depending how you respond.  Let me know in a week how you are doing I can send in formal prescription if improved.  Also want you start taking over-the-counter Metamucil or Benefiber once daily.  Continue to drink plenty water.  Follow-up in 4 weeks.  We may consider hemorrhoid banding in the future.  It was very nice seeing you again today.  Dr. Marletta Lor

## 2023-04-13 NOTE — Progress Notes (Signed)
 Referring Provider: [place], Betty G, MD Primary Care Physician:  [place], Betty G, MD Primary GI:  Dr. Marletta Lor  Chief Complaint  Patient presents with   Follow-up    Follow up on GERD and bowel leakage that has gotten worse. Pt has a lot of nausea    HPI:   [name] is a 63 y.o. female who presents to clinic today for follow-up visit.  Last seen November 2022.  Patient with new complaint of fecal smearing.  Has been occurring for approximately 6 months.  Notes small amount of stool that will leak out of her anus in between bowel movements.  Wearing a pad to help with this.  No melena hematochezia.  Has tried MiraLAX, changes in her diet without improvement.  Colonoscopy 12/14/2020 internal hemorrhoids, sigmoid and descending colon diverticulosis, moderate amount of stool in the colon.  Does note occasional constipation symptoms.  Some associated left lower quadrant abdominal pain as well.  Abdominal pain improved with bowel movements.  Feels like she has incomplete emptying at times.  Past Medical History:  Diagnosis Date   Allergic rhinitis    Anxiety    Asthma    Chronic bronchitis    Constipation    Depression    GERD (gastroesophageal reflux disease)    History of eustachian tube dysfunction 09/14/2017   Hypertension    Migraines    Plantar fasciitis    PONV (postoperative nausea and vomiting)     Past Surgical History:  Procedure Laterality Date   COLONOSCOPY  MAY 2012 ARS   SLF: NSAID COLON ULCERS, SML IH   COLONOSCOPY WITH PROPOFOL N/A 12/14/2020   Procedure: COLONOSCOPY WITH PROPOFOL;  Surgeon: Lanelle Bal, DO;  Location: [facility];  Service: Endoscopy;  Laterality: N/A;  7:30am   KNEE ARTHROSCOPY WITH MEDIAL MENISECTOMY Left 10/05/2012   Procedure: LEFT KNEE ARTHROSCOPY WITH MEDIAL MENISECTOMY;  Surgeon: Vickki Hearing, MD;  Location: [facility] OR[facility];  Service: Orthopedics;  Laterality: Left;   MYRINGOTOMY     NASAL SINUS SURGERY     right foot      plantar fascitis   TONSILLECTOMY     UPPER GASTROINTESTINAL ENDOSCOPY  MAY 2012 DYSPHAGIA   FA:OZHYQMVHQ/[id number], ESO STR-SAV DIL 15 MM   UPPER GASTROINTESTINAL ENDOSCOPY  MAY 2012   SAV DIL , next one w/ propofol    Current Outpatient Medications  Medication Sig Dispense Refill   ALPRAZolam (XANAX) 1 MG tablet TAKE (1/2) TO 1 TABLET BY MOUTH AT BEDTIME AS NEEDED FOR ANXIETY. 30 tablet 3   Cholecalciferol (VITAMIN D3) 125 MCG/ML LIQD Take 5,000 Units by mouth every 3 (three) days.     clotrimazole-betamethasone (LOTRISONE) cream Apply 1 Application topically daily as needed. 30 g 2   Fluticasone-Umeclidin-Vilant (TRELEGY ELLIPTA) 100-62.5-25 MCG/ACT AEPB Inhale 1 puff into the lungs daily. 1 each 3   omeprazole (PRILOSEC) 40 MG capsule Take 1 capsule (40 mg total) by mouth daily. 30 capsule 1   triamterene-hydrochlorothiazide (MAXZIDE) 75-50 MG tablet Take 0.5 tablets by mouth daily. 90 tablet 2   verapamil (VERELAN) 180 MG 24 hr capsule Take 1 capsule (180 mg total) by mouth at bedtime. 90 capsule 2   No current facility-administered medications for this visit.    Allergies as of 04/13/2023 - Review Complete 04/13/2023  Allergen Reaction Noted   Levaquin [levofloxacin in d5w] Nausea Only 08/07/2012    Family History  Problem Relation Age of Onset   Lung cancer Mother  smoker   Hypertension Mother    Heart failure Father    Colon polyps Neg Hx    Crohn's disease Neg Hx    Breast cancer Neg Hx     Social History   Socioeconomic History   Marital status: Single    Spouse name: Not on file   Number of children: 0   Years of education: Not on file   Highest education level: Some college, no degree  Occupational History   Occupation: Surveyor, minerals    Comment: Engineer, maintenance (IT)  Tobacco Use   Smoking status: Never   Smokeless tobacco: Never  Vaping Use   Vaping status: Never Used  Substance and Sexual Activity   Alcohol use: No   Drug use:  No   Sexual activity: Never    Birth control/protection: Other-see comments  Other Topics Concern   Not on file  Social History Narrative   Single,lives alone.Part time transportation at [facility] in [place].   Social Drivers of Corporate investment banker Strain: Not on file  Food Insecurity: Not on file  Transportation Needs: No Transportation Needs (03/07/2022)   PRAPARE - Administrator, Civil Service (Medical): No    Lack of Transportation (Non-Medical): No  Physical Activity: Sufficiently Active (03/07/2022)   Exercise Vital Sign    Days of Exercise per Week: 5 days    Minutes of Exercise per Session: 70 min  Stress: No Stress Concern Present (03/07/2022)   [employer] of Occupational Health - Occupational Stress Questionnaire    Feeling of Stress : Not at all  Social Connections: Unknown (03/07/2022)   Social Connection and Isolation Panel [NHANES]    Frequency of Communication with Friends and Family: Once a week    Frequency of Social Gatherings with Friends and Family: Not on file    Attends Religious Services: More than 4 times per year    Active Member of [employer] or Organizations: Yes    Attends Engineer, structural: More than 4 times per year    Marital Status: Never married    Subjective: Review of Systems  Constitutional:  Negative for chills and fever.  HENT:  Negative for congestion and hearing loss.   Eyes:  Negative for blurred vision and double vision.  Respiratory:  Negative for cough and shortness of breath.   Cardiovascular:  Negative for chest pain and palpitations.  Gastrointestinal:  Negative for abdominal pain, blood in stool, constipation, diarrhea, heartburn, melena and vomiting.       Fecal smearing  Genitourinary:  Negative for dysuria and urgency.  Musculoskeletal:  Negative for joint pain and myalgias.  Skin:  Negative for itching and rash.  Neurological:  Negative for dizziness and headaches.   Psychiatric/Behavioral:  Negative for depression. The patient is not nervous/anxious.      Objective: BP (!) 159/81   Pulse (!) 58   Temp 98.6 F (37 C)   Ht 5\' 6"  (1.676 m)   Wt 153 lb 12.8 oz (69.8 kg)   BMI 24.82 kg/m  Physical Exam Constitutional:      Appearance: Normal appearance.  HENT:     Head: Normocephalic and atraumatic.  Eyes:     Extraocular Movements: Extraocular movements intact.     Conjunctiva/sclera: Conjunctivae normal.  Cardiovascular:     Rate and Rhythm: Normal rate and regular rhythm.  Pulmonary:     Effort: Pulmonary effort is normal.     Breath sounds: Normal breath sounds.  Abdominal:     General: Bowel sounds are normal.     Palpations: Abdomen is soft.  Musculoskeletal:        General: No swelling. Normal range of motion.     Cervical back: Normal range of motion and neck supple.  Skin:    General: Skin is warm and dry.     Coloration: Skin is not jaundiced.  Neurological:     General: No focal deficit present.     Mental Status: She is alert and oriented to person, place, and time.  Psychiatric:        Mood and Affect: Mood normal.        Behavior: Behavior normal.      Assessment: *Fecal smearing *Constipation *Left lower quadrant abdominal pain  Plan: Discussed in depth with patient today.  Will give samples of Linzess 72 mcg daily and see how she does.  Counseled we have room to increase dosage depending on how she responds and she understands.  Recommend adding fiber therapy 1-2 times daily.  Patient drinks significant amount of water throughout the day.  Recommend she continue this.  Follow-up in 4 weeks, can consider hemorrhoid banding as adjunct therapy as well.  May need to consider updating her colonoscopy vs flexible sigmoidoscopy  04/13/2023 1:17 PM   Disclaimer: This note was dictated with voice recognition software. Similar sounding words can inadvertently be transcribed and may not be corrected upon  review.

## 2023-04-27 ENCOUNTER — Encounter: Payer: Self-pay | Admitting: Internal Medicine

## 2023-05-01 ENCOUNTER — Encounter: Payer: Self-pay | Admitting: Internal Medicine

## 2023-05-02 ENCOUNTER — Other Ambulatory Visit: Payer: Self-pay

## 2023-05-02 MED ORDER — LINACLOTIDE 72 MCG PO CAPS: 72.0000 ug | ORAL_CAPSULE | Freq: Every day | ORAL | 6 refills | Status: DC

## 2023-05-10 ENCOUNTER — Ambulatory Visit (INDEPENDENT_AMBULATORY_CARE_PROVIDER_SITE_OTHER): Admitting: Gastroenterology

## 2023-05-10 ENCOUNTER — Encounter: Payer: Self-pay | Admitting: Gastroenterology

## 2023-05-10 VITALS — BP 148/67 | HR 46 | Temp 97.1°F | Ht 66.0 in | Wt 155.1 lb

## 2023-05-10 DIAGNOSIS — R151 Fecal smearing: Secondary | ICD-10-CM | POA: Diagnosis not present

## 2023-05-10 DIAGNOSIS — Z8719 Personal history of other diseases of the digestive system: Secondary | ICD-10-CM

## 2023-05-10 DIAGNOSIS — K5900 Constipation, unspecified: Secondary | ICD-10-CM | POA: Insufficient documentation

## 2023-05-10 NOTE — Progress Notes (Signed)
 Gastroenterology Office Note     Primary Care Physician:  [place], Betty G, MD  Primary Gastroenterologist: Dr. Marletta Lor    Chief Complaint   Chief Complaint  Patient presents with   Follow-up    Patient here today for a follow up on constipation. Patient says Linzess 72 mcg was working for her, but she is now having some issues. She is also using metamucil once per day.   Fecal incontenance    Patient has had some issues of fecal incontinence.      History of Present Illness   [name] is a 64 y.o. female presenting today with a history of constipation and fecal smearing for past 6 months. She was seen 04/13/23 and was advised to start Metamucil and Linzess 72 mcg. Here for follow-up.     Did well adding Metmuacil and had immediate improvement. Linzess 72 mcg started and was great. Ended up missing a few doses due to prescriptions. Started having bloating and less productive after missing doses. This evened out after more consistent dosing of linzess. No more fecal seepage. Stopped as soon as started Metamucil.   Colonoscopy 12/14/2020 internal hemorrhoids, sigmoid and descending colon diverticulosis, moderate amount of stool in the colon but able to lavage this out.      Past Medical History:  Diagnosis Date   Allergic rhinitis    Anxiety    Asthma    Chronic bronchitis    Constipation    Depression    GERD (gastroesophageal reflux disease)    History of eustachian tube dysfunction 09/14/2017   Hypertension    Migraines    Plantar fasciitis    PONV (postoperative nausea and vomiting)     Past Surgical History:  Procedure Laterality Date   COLONOSCOPY  MAY 2012 ARS   SLF: NSAID COLON ULCERS, SML IH   COLONOSCOPY WITH PROPOFOL N/A 12/14/2020   Procedure: COLONOSCOPY WITH PROPOFOL;  Surgeon: Lanelle Bal, DO;  Location: [facility];  Service: Endoscopy;  Laterality: N/A;  7:30am   KNEE ARTHROSCOPY WITH MEDIAL MENISECTOMY Left 10/05/2012    Procedure: LEFT KNEE ARTHROSCOPY WITH MEDIAL MENISECTOMY;  Surgeon: Vickki Hearing, MD;  Location: [facility];  Service: Orthopedics;  Laterality: Left;   MYRINGOTOMY     NASAL SINUS SURGERY     right foot     plantar fascitis   TONSILLECTOMY     UPPER GASTROINTESTINAL ENDOSCOPY  MAY 2012 DYSPHAGIA   [id number], ESO STR-SAV DIL 15 MM   UPPER GASTROINTESTINAL ENDOSCOPY  MAY 2012   SAV DIL , next one w/ propofol    Current Outpatient Medications  Medication Sig Dispense Refill   ALPRAZolam (XANAX) 1 MG tablet TAKE (1/2) TO 1 TABLET BY MOUTH AT BEDTIME AS NEEDED FOR ANXIETY. 30 tablet 3   Cholecalciferol (VITAMIN D3) 125 MCG/ML LIQD Take 5,000 Units by mouth every 3 (three) days.     clotrimazole-betamethasone (LOTRISONE) cream Apply 1 Application topically daily as needed. 30 g 2   Fluticasone-Umeclidin-Vilant (TRELEGY ELLIPTA) 100-62.5-25 MCG/ACT AEPB Inhale 1 puff into the lungs daily. 1 each 3   linaclotide (LINZESS) 72 MCG capsule Take 1 capsule (72 mcg total) by mouth daily before breakfast. 30 capsule 6   triamterene-hydrochlorothiazide (MAXZIDE) 75-50 MG tablet Take 0.5 tablets by mouth daily. 90 tablet 2   verapamil (VERELAN) 180 MG 24 hr capsule Take 1 capsule (180 mg total) by mouth at bedtime. 90 capsule 2   omeprazole (PRILOSEC) 40 MG capsule  Take 1 capsule (40 mg total) by mouth daily. (Patient not taking: Reported on 05/10/2023) 30 capsule 1   No current facility-administered medications for this visit.    Allergies as of 05/10/2023 - Review Complete 05/10/2023  Allergen Reaction Noted   Levaquin [levofloxacin in d5w] Nausea Only 08/07/2012    Family History  Problem Relation Age of Onset   Lung cancer Mother        smoker   Hypertension Mother    Heart failure Father    Colon polyps Neg Hx    Crohn's disease Neg Hx    Breast cancer Neg Hx     Social History   Socioeconomic History   Marital status: Single    Spouse name: Not on file   Number  of children: 0   Years of education: Not on file   Highest education level: Some college, no degree  Occupational History   Occupation: Surveyor, minerals    Comment: Engineer, maintenance (IT)  Tobacco Use   Smoking status: Never   Smokeless tobacco: Never  Vaping Use   Vaping status: Never Used  Substance and Sexual Activity   Alcohol use: Yes    Comment: Wine at night some times to help her gut.   Drug use: No   Sexual activity: Never    Birth control/protection: Other-see comments  Other Topics Concern   Not on file  Social History Narrative   Single,lives alone.Part time transportation at [facility] in [place].   Social Drivers of Corporate investment banker Strain: Not on file  Food Insecurity: Not on file  Transportation Needs: No Transportation Needs (03/07/2022)   PRAPARE - Administrator, Civil Service (Medical): No    Lack of Transportation (Non-Medical): No  Physical Activity: Sufficiently Active (03/07/2022)   Exercise Vital Sign    Days of Exercise per Week: 5 days    Minutes of Exercise per Session: 70 min  Stress: No Stress Concern Present (03/07/2022)   [employer] of Occupational Health - Occupational Stress Questionnaire    Feeling of Stress : Not at all  Social Connections: Unknown (03/07/2022)   Social Connection and Isolation Panel [NHANES]    Frequency of Communication with Friends and Family: Once a week    Frequency of Social Gatherings with Friends and Family: Not on file    Attends Religious Services: More than 4 times per year    Active Member of [employer] or Organizations: Yes    Attends Engineer, structural: More than 4 times per year    Marital Status: Never married  Intimate Partner Violence: Not on file     Review of Systems   Gen: Denies any fever, chills, fatigue, weight loss, lack of appetite.  CV: Denies chest pain, heart palpitations, peripheral edema, syncope.  Resp: Denies shortness of breath  at rest or with exertion. Denies wheezing or cough.  GI: Denies dysphagia or odynophagia. Denies jaundice, hematemesis, fecal incontinence. GU : Denies urinary burning, urinary frequency, urinary hesitancy MS: Denies joint pain, muscle weakness, cramps, or limitation of movement.  Derm: Denies rash, itching, dry skin Psych: Denies depression, anxiety, memory loss, and confusion Heme: Denies bruising, bleeding, and enlarged lymph nodes.   Physical Exam   BP (!) 148/67 (BP Location: Left Arm, Patient Position: Sitting, Cuff Size: Normal)   Pulse (!) 46   Temp (!) 97.1 F (36.2 C) (Temporal)   Ht 5\' 6"  (1.676 m)   Wt 155 lb 1.6  oz (70.4 kg)   BMI 25.03 kg/m  General:   Alert and oriented. Pleasant and cooperative. Well-nourished and well-developed.  Head:  Normocephalic and atraumatic. Eyes:  Without icterus Abdomen:  +BS, soft, non-tender and non-distended. No HSM noted. No guarding or rebound. No masses appreciated.  Rectal:  Deferred  Msk:  Symmetrical without gross deformities. Normal posture. Extremities:  Without edema. Neurologic:  Alert and  oriented x4;  grossly normal neurologically. Skin:  Intact without significant lesions or rashes. Psych:  Alert and cooperative. Normal mood and affect.   Assessment   Talon Witting is a 63 y.o. female presenting today with a history of constipation and fecal smearing for past 6 months. She was seen 04/13/23 and was advised to start Metamucil and Linzess 72 mcg. Here for follow-up.   She is doing wonderfully with addition of Metamucil and low dose Linzess. Fecal smearing was likely due to lax sphincter tone +/- internal hemorrhoids. Colonoscopy on file 2022 with internal hemorrhoids and diverticulosis.  As she is doing well, we will see her in year.    PLAN   Continue Linzess 72 mcg and Benefiber Can pursue banding if any further fecal seepage Return in 1 year Colonoscopy 2032   Kelsey Mink, PhD, [facility]  Gastroenterology

## 2023-05-10 NOTE — Patient Instructions (Signed)
 I am glad you are doing well!  Continue the Metamucil and Linzess as you are doing.   We will see you back in 1 year or sooner if needed!   It was a pleasure to see you today. I want to create trusting relationships with patients and provide genuine, compassionate, and quality care. I truly value your feedback, so please be on the lookout for a survey regarding your visit with me today. I appreciate your time in completing this!         Gelene Mink, PhD, ANP-BC [facility] Gastroenterology

## 2023-05-18 ENCOUNTER — Encounter: Payer: Self-pay | Admitting: Family Medicine

## 2023-05-18 DIAGNOSIS — F411 Generalized anxiety disorder: Secondary | ICD-10-CM

## 2023-05-18 DIAGNOSIS — I10 Essential (primary) hypertension: Secondary | ICD-10-CM

## 2023-05-18 MED ORDER — LINACLOTIDE 145 MCG PO CAPS
145.0000 ug | ORAL_CAPSULE | Freq: Every day | ORAL | 3 refills | Status: AC
Start: 2023-05-18 — End: ?

## 2023-05-22 MED ORDER — VERAPAMIL HCL ER 180 MG PO CP24: 180.0000 mg | ORAL_CAPSULE | Freq: Every day | ORAL | 2 refills | Status: DC

## 2023-05-23 MED ORDER — ALPRAZOLAM 1 MG PO TABS: ORAL_TABLET | ORAL | 3 refills | Status: DC

## 2023-05-29 ENCOUNTER — Other Ambulatory Visit: Payer: Self-pay | Admitting: Family Medicine

## 2023-05-29 DIAGNOSIS — I10 Essential (primary) hypertension: Secondary | ICD-10-CM

## 2023-05-31 ENCOUNTER — Encounter: Payer: Self-pay | Admitting: Family Medicine

## 2023-05-31 ENCOUNTER — Ambulatory Visit (INDEPENDENT_AMBULATORY_CARE_PROVIDER_SITE_OTHER): Admitting: Family Medicine

## 2023-05-31 VITALS — BP 156/95 | HR 60 | Temp 97.7°F | Resp 16 | Ht 66.0 in | Wt 156.6 lb

## 2023-05-31 DIAGNOSIS — R9431 Abnormal electrocardiogram [ECG] [EKG]: Secondary | ICD-10-CM | POA: Diagnosis not present

## 2023-05-31 DIAGNOSIS — I10 Essential (primary) hypertension: Secondary | ICD-10-CM

## 2023-05-31 DIAGNOSIS — R0609 Other forms of dyspnea: Secondary | ICD-10-CM

## 2023-05-31 DIAGNOSIS — R001 Bradycardia, unspecified: Secondary | ICD-10-CM | POA: Diagnosis not present

## 2023-05-31 MED ORDER — VERAPAMIL HCL ER 120 MG PO TBCR
120.0000 mg | EXTENDED_RELEASE_TABLET | Freq: Every day | ORAL | 2 refills | Status: DC
Start: 2023-05-31 — End: 2023-11-06

## 2023-05-31 MED ORDER — VALSARTAN 160 MG PO TABS: 160.0000 mg | ORAL_TABLET | Freq: Every day | ORAL | 3 refills | Status: AC

## 2023-05-31 NOTE — Progress Notes (Addendum)
 HPI: [name] is a 63 y.o. female with a PMHx significant for HTN, asthma, GERD, sensorineural hearing loss, vitamin D  deficiency, HLD, insomnia, and anxiety, who is here today for hypertension management.  Last seen on 03/08/2023  Hypertension:  Patient has been checking her BP at home and says it has been 140s-160s/80s throughout the last two months. She says she feels "funny" when her BP is high.  Also endorses occasional SOB with exertion. Denies associated diaphoresis. She has been on verapamil  for several years, no hx of cluster headaches or arrhythmias.  No recent increases in stress or changes in her sleep.   Diet: She has been trying to eat more vegetables and less sweets. She cooks with salt, but doesn't use a salt shaker frequently.  Medications: Currently on triamterene -hydrochlorothiazide 37.5-25 mg daily and verapamil  180 mg at bedtime.   Negative for unusual or severe headache, visual changes, exertional chest pain, palpitations focal weakness, or edema.  Lab Results  Component Value Date   CREATININE 0.86 03/08/2023   BUN 13 03/08/2023   NA 138 03/08/2023   K 4.3 03/08/2023   CL 101 03/08/2023   CO2 28 03/08/2023   Review of Systems  Constitutional:  Negative for activity change, appetite change and fever.  HENT:  Negative for nosebleeds and sore throat.   Gastrointestinal:  Negative for abdominal pain, nausea and vomiting.  Genitourinary:  Negative for decreased urine volume and hematuria.  Skin:  Negative for rash.  Allergic/Immunologic: Positive for environmental allergies.  Neurological:  Negative for syncope and facial asymmetry.  Psychiatric/Behavioral:  Negative for confusion and hallucinations.   See other pertinent positives and negatives in HPI.  Current Outpatient Medications on File Prior to Visit  Medication Sig Dispense Refill   ALPRAZolam  (XANAX ) 1 MG tablet TAKE (1/2) TO 1 TABLET BY MOUTH AT BEDTIME AS NEEDED FOR ANXIETY. 30  tablet 3   Cholecalciferol (VITAMIN D3) 125 MCG/ML LIQD Take 5,000 Units by mouth every 3 (three) days.     clotrimazole -betamethasone  (LOTRISONE ) cream Apply 1 Application topically daily as needed. 30 g 2   Fluticasone -Umeclidin-Vilant (TRELEGY ELLIPTA ) 100-62.5-25 MCG/ACT AEPB Inhale 1 puff into the lungs daily. 1 each 3   linaclotide  (LINZESS ) 145 MCG CAPS capsule Take 1 capsule (145 mcg total) by mouth daily before breakfast. 90 capsule 3   linaclotide  (LINZESS ) 72 MCG capsule Take 1 capsule (72 mcg total) by mouth daily before breakfast. 30 capsule 6   triamterene -hydrochlorothiazide (MAXZIDE) 75-50 MG tablet Take 0.5 tablets by mouth daily. 90 tablet 2   No current facility-administered medications on file prior to visit.    Past Medical History:  Diagnosis Date   Allergic rhinitis    Anxiety    Asthma    Chronic bronchitis    Constipation    Depression    GERD (gastroesophageal reflux disease)    History of eustachian tube dysfunction 09/14/2017   Hypertension    Migraines    Plantar fasciitis    PONV (postoperative nausea and vomiting)    Allergies  Allergen Reactions   Levaquin [Levofloxacin In D5w] Nausea Only   Social History   Socioeconomic History   Marital status: Single    Spouse name: Not on file   Number of children: 0   Years of education: Not on file   Highest education level: Some college, no degree  Occupational History   Occupation: Surveyor, minerals    Comment: Engineer, maintenance (IT)  Tobacco Use   Smoking status: Never  Smokeless tobacco: Never  Vaping Use   Vaping status: Never Used  Substance and Sexual Activity   Alcohol use: Yes    Comment: Wine at night some times to help her gut.   Drug use: No   Sexual activity: Never    Birth control/protection: Other-see comments  Other Topics Concern   Not on file  Social History Narrative   Single,lives alone.Part time transportation at [facility] in [place].   Social  Drivers of Corporate investment banker Strain: Low Risk  (05/31/2023)   Overall Financial Resource Strain (CARDIA)    Difficulty of Paying Living Expenses: Not hard at all  Food Insecurity: No Food Insecurity (05/31/2023)   Hunger Vital Sign    Worried About Running Out of Food in the Last Year: Never true    Ran Out of Food in the Last Year: Never true  Transportation Needs: No Transportation Needs (05/31/2023)   PRAPARE - Administrator, Civil Service (Medical): No    Lack of Transportation (Non-Medical): No  Physical Activity: Sufficiently Active (05/31/2023)   Exercise Vital Sign    Days of Exercise per Week: 5 days    Minutes of Exercise per Session: 70 min  Stress: No Stress Concern Present (05/31/2023)   [employer] of Occupational Health - Occupational Stress Questionnaire    Feeling of Stress : Not at all  Social Connections: Moderately Integrated (05/31/2023)   Social Connection and Isolation Panel [NHANES]    Frequency of Communication with Friends and Family: More than three times a week    Frequency of Social Gatherings with Friends and Family: Three times a week    Attends Religious Services: More than 4 times per year    Active Member of Clubs or Organizations: Yes    Attends Banker Meetings: More than 4 times per year    Marital Status: Never married   Vitals:   05/31/23 1400  BP: (!) 158/96  Pulse: 60  Resp: 16  Temp: 97.7 F (36.5 C)  SpO2: 95%   Body mass index is 25.28 kg/m.  Physical Exam Vitals and nursing note reviewed.  Constitutional:      General: She is not in acute distress.    Appearance: She is well-developed.  HENT:     Head: Normocephalic and atraumatic.     Mouth/Throat:     Mouth: Mucous membranes are moist.     Pharynx: Oropharynx is clear.  Eyes:     Conjunctiva/sclera: Conjunctivae normal.  Cardiovascular:     Rate and Rhythm: Normal rate and regular rhythm.     Pulses:          Dorsalis pedis pulses  are 2+ on the right side and 2+ on the left side.     Heart sounds: No murmur heard. Pulmonary:     Effort: Pulmonary effort is normal. No respiratory distress.     Breath sounds: Normal breath sounds.  Abdominal:     Palpations: Abdomen is soft. There is no mass.     Tenderness: There is no abdominal tenderness.  Lymphadenopathy:     Cervical: No cervical adenopathy.  Skin:    General: Skin is warm.     Findings: No erythema or rash.  Neurological:     General: No focal deficit present.     Mental Status: She is alert and oriented to person, place, and time.     Cranial Nerves: No cranial nerve deficit.  Gait: Gait normal.  Psychiatric:        Mood and Affect: Mood and affect normal.    ASSESSMENT AND PLAN:  Ms. Enzor was seen today for chronic disease management.   Orders Placed This Encounter  Procedures   Basic metabolic panel with GFR   Ambulatory referral to Cardiology   EKG 12-Lead   DOE (dyspnea on exertion) Assessment & Plan: She has had this problem for some time, attributed to asthma.  For the past few days he has been more noticeable, especially when going upstairs. EKG today: T wave abnormalities on anterior leads, which is new when compared with EKG done in 12/2020. 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 Cardiology referral placed.  Orders: -     EKG 12-Lead -     Ambulatory referral to Cardiology  Essential hypertension, benign Assessment & Plan: BP is not well controlled. Possible complications of elevated BP discussed. Changes today: Verapamil  dose decreased from 180 mg to 120 mg.  Continue triamterene  HCTZ 75-50 mg 1/2 tablet daily.  Valsartan  160 mg added today to start 1/2 tablet daily. Continue monitoring BP regularly. BMP in 10 to 14 days. Low salt diet. Monitor BP at home. Instructed about warning signs. Follow-up in 4 weeks, before if needed..  Orders: -      Valsartan ; Take 1 tablet (160 mg total) by mouth daily.  Dispense: 90 tablet; Refill: 3 -     Verapamil  HCl ER; Take 1 tablet (120 mg total) by mouth at bedtime.  Dispense: 30 tablet; Refill: 2 -     EKG 12-Lead -     Basic metabolic panel with GFR; Future  Abnormal EKG -     Ambulatory referral to Cardiology  Sinus bradycardia She has had bradycardia for some time, asymptomatic. Verapamil  dose decreased from 180 mg daily to 120 mg daily and will plan on continuing weaning of medication as tolerated. Continue monitoring HR at home.  Return in about 4 weeks (around 06/28/2023) for HTN. Lab in 2-3 weeks.  I, Fritz Jewel Wierda, acting as a scribe for Malachi Suderman [place], MD., have documented all relevant documentation on the behalf of Linday Rhodes [place], MD, as directed by  Kaitlin Ardito [place], MD while in the presence of Lakeria Starkman [place], MD.   I, Bernadetta Roell [place], MD, have reviewed all documentation for this visit. The documentation on 05/31/23 for the exam, diagnosis, procedures, and orders are all accurate and complete.  Parker Sawatzky G. [place], MD  [facility]. [place] office.

## 2023-05-31 NOTE — Patient Instructions (Addendum)
 A few things to remember from today's visit:  Essential hypertension, benign - Plan: valsartan (DIOVAN) 160 MG tablet, verapamil  (CALAN -SR) 120 MG CR tablet, EKG 12-Lead  DOE (dyspnea on exertion) - Plan: EKG 12-Lead  Decrease Verapamil  from 180 mg daily to 120 mg daily.  My plan is to continue decreasing dose as tolerated and may be weaning of medication.  Start valsartan 160 mg 1/2 tab daily. Continue monitoring blood pressure regularly. No changes in triamterene -hydrochlorothiazide dose. Blood work in 2 to 3 weeks. EKG today has some new abnormalities when compared with prior EKGs, so appointment with cardiology will be arranged.  If you need refills for medications you take chronically, please call your pharmacy. Do not use My Chart to request refills or for acute issues that need immediate attention. If you send a my chart message, it may take a few days to be addressed, specially if I am not in the office.  Please be sure medication list is accurate. If a new problem present, please set up appointment sooner than planned today.

## 2023-05-31 NOTE — Assessment & Plan Note (Signed)
 She has had this problem for some time, attributed to asthma.  For the past few days he has been more noticeable, especially when going upstairs. EKG today: T wave abnormalities on anterior leads, which is new when compared with EKG done in 12/2020. 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 Cardiology referral placed.

## 2023-05-31 NOTE — Assessment & Plan Note (Signed)
 BP is not well controlled. Possible complications of elevated BP discussed. Changes today: Verapamil  dose decreased from 180 mg to 120 mg.  Continue triamterene  HCTZ 75-50 mg 1/2 tablet daily.  Valsalva and 60 mg added today to start 1/2 tablet daily. Continue monitoring BP regularly. BMP in 10 to 14 days. Low salt diet. Monitor BP at home. Instructed about warning signs. Follow-up in 4 weeks, before if needed.[name]

## 2023-06-05 ENCOUNTER — Other Ambulatory Visit: Payer: Self-pay | Admitting: Family Medicine

## 2023-06-05 DIAGNOSIS — Z1231 Encounter for screening mammogram for malignant neoplasm of breast: Secondary | ICD-10-CM

## 2023-06-21 ENCOUNTER — Other Ambulatory Visit (INDEPENDENT_AMBULATORY_CARE_PROVIDER_SITE_OTHER)

## 2023-06-21 ENCOUNTER — Other Ambulatory Visit

## 2023-06-21 DIAGNOSIS — I10 Essential (primary) hypertension: Secondary | ICD-10-CM | POA: Diagnosis not present

## 2023-06-21 LAB — BASIC METABOLIC PANEL WITH GFR
BUN: 21 mg/dL (ref 6–23)
CO2: 29 meq/L (ref 19–32)
Calcium: 9.7 mg/dL (ref 8.4–10.5)
Chloride: 98 meq/L (ref 96–112)
Creatinine, Ser: 0.83 mg/dL (ref 0.40–1.20)
GFR: 74.52 mL/min (ref >60.00)
Glucose, Bld: 101 mg/dL — ABNORMAL HIGH (ref 70–99)
Potassium: 3.8 meq/L (ref 3.5–5.1)
Sodium: 134 meq/L — ABNORMAL LOW (ref 135–145)

## 2023-06-22 ENCOUNTER — Ambulatory Visit: Payer: Self-pay | Admitting: Family Medicine

## 2023-06-28 ENCOUNTER — Encounter: Payer: Self-pay | Admitting: Family Medicine

## 2023-06-28 ENCOUNTER — Ambulatory Visit: Admitting: Family Medicine

## 2023-06-28 VITALS — BP 128/82 | HR 59 | Resp 16 | Ht 66.0 in | Wt 157.1 lb

## 2023-06-28 DIAGNOSIS — F411 Generalized anxiety disorder: Secondary | ICD-10-CM

## 2023-06-28 DIAGNOSIS — R001 Bradycardia, unspecified: Secondary | ICD-10-CM | POA: Diagnosis not present

## 2023-06-28 DIAGNOSIS — I10 Essential (primary) hypertension: Secondary | ICD-10-CM

## 2023-06-28 NOTE — Assessment & Plan Note (Signed)
 Continue alprazolam  1 mg 1/2 to 1 tablet daily as needed. F/U in 5 months.

## 2023-06-28 NOTE — Assessment & Plan Note (Signed)
 BP has improved, a few his BPs 130s-140s. She agrees with continuing decreasing verapamil  dose, she just got a refill for verapamil  180 mg cap, last visit it was decreased to 120 mg tab.  She would like to try splitting capsule content in half instead ordering new dose of verapamil , 90 mg daily. Valsartan  increased from 80 mg to 160 mg, she can take the whole tablet. Continue triamterene -hydrochlorothiazide 75-50 mg 1/2 tablet daily as well as low-salt diet. Continue monitoring BP regularly. She has an appointment with cardiologist on 07/25/2023.

## 2023-06-28 NOTE — Patient Instructions (Signed)
 A few things to remember from today's visit:  Essential hypertension, benign Decrease verapamil  to 90 mg daily and increase valsartan  to the whole tab. Continue monitoring blood pressure frequently for the next 2 weeks and then continue 1-2 per month. No changes in rest. Keep appt with cardiologist. See you back in 11/2023, before of needed.  If you need refills for medications you take chronically, please call your pharmacy. Do not use My Chart to request refills or for acute issues that need immediate attention. If you send a my chart message, it may take a few days to be addressed, specially if I am not in the office.  Please be sure medication list is accurate. If a new problem present, please set up appointment sooner than planned today.

## 2023-06-28 NOTE — Progress Notes (Signed)
 HPI: [name] is a 63 y.o. female with a PMHx significant for HTN, asthma, GERD, sensorineural hearing loss, vitamin D  deficiency, HLD, insomnia, and anxiety, who is here today for chronic disease management.  Last seen on 05/31/2023, when BP was elevated, verapamil  dose was decreased and valsartan  was added.  Hypertension: Currently on triamterene -hydrochlorothiazide 75-50 mg 0.5 tablets daily, valsartan  160 mg 1/2 tab daily, and verapamil  120 mg daily. She is tolerating medication well.  BP readings at home: She has been checking her BP at home and her readings have improved. Most BP's 120-130's/70's. A few SBP's 140's-150's. Has an appointment with cardiology will be 6/24 (Dr. Arthea Larsson). Negative for unusual or severe headache, visual changes, exertional chest pain, worsening dyspnea, palpitations, focal weakness, or edema.  Lab Results  Component Value Date   NA 134 (L) 06/21/2023   CL 98 06/21/2023   K 3.8 06/21/2023   CO2 29 06/21/2023   BUN 21 06/21/2023   CREATININE 0.83 06/21/2023   GFR 74.52 06/21/2023   CALCIUM 9.7 06/21/2023   ALBUMIN 4.2 09/05/2022   GLUCOSE 101 (H) 06/21/2023   Sinus bradycardia: HR at home a few times in the 50's/min.  Anxiety:  Currently on alprazolam  1 mg 0.5-1 tablet as needed at bedtime.   Review of Systems  Constitutional:  Negative for activity change, appetite change and fever.  HENT:  Negative for sore throat.   Respiratory:  Negative for cough and wheezing.   Gastrointestinal:  Negative for abdominal pain, nausea and vomiting.  Genitourinary:  Negative for decreased urine volume and hematuria.  Skin:  Negative for rash.  Neurological:  Negative for syncope and facial asymmetry.  Psychiatric/Behavioral:  Negative for confusion and hallucinations.   See other pertinent positives and negatives in HPI.  Current Outpatient Medications on File Prior to Visit  Medication Sig Dispense Refill   ALPRAZolam  (XANAX ) 1 MG  tablet TAKE (1/2) TO 1 TABLET BY MOUTH AT BEDTIME AS NEEDED FOR ANXIETY. 30 tablet 3   clotrimazole -betamethasone  (LOTRISONE ) cream Apply 1 Application topically daily as needed. 30 g 2   Fluticasone -Umeclidin-Vilant (TRELEGY ELLIPTA ) 100-62.5-25 MCG/ACT AEPB Inhale 1 puff into the lungs daily. 1 each 3   linaclotide  (LINZESS ) 145 MCG CAPS capsule Take 1 capsule (145 mcg total) by mouth daily before breakfast. 90 capsule 3   triamterene -hydrochlorothiazide (MAXZIDE) 75-50 MG tablet Take 0.5 tablets by mouth daily. 90 tablet 2   valsartan  (DIOVAN ) 160 MG tablet Take 1 tablet (160 mg total) by mouth daily. 90 tablet 3   verapamil  (CALAN -SR) 120 MG CR tablet Take 1 tablet (120 mg total) by mouth at bedtime. 30 tablet 2   No current facility-administered medications on file prior to visit.    Past Medical History:  Diagnosis Date   Allergic rhinitis    Anxiety    Asthma    Chronic bronchitis    Constipation    Depression    GERD (gastroesophageal reflux disease)    History of eustachian tube dysfunction 09/14/2017   Hypertension    Migraines    Plantar fasciitis    PONV (postoperative nausea and vomiting)    Allergies  Allergen Reactions   Levaquin [Levofloxacin In D5w] Nausea Only    Social History   Socioeconomic History   Marital status: Single    Spouse name: Not on file   Number of children: 0   Years of education: Not on file   Highest education level: Some college, no degree  Occupational History   Occupation:  outreach librarian    Comment: [employer]  Tobacco Use   Smoking status: Never   Smokeless tobacco: Never  Vaping Use   Vaping status: Never Used  Substance and Sexual Activity   Alcohol use: Yes    Comment: Wine at night some times to help her gut.   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   Social Drivers of Corporate investment banker Strain: Low Risk  (05/31/2023)   Overall Financial Resource Strain (CARDIA)    Difficulty of Paying Living Expenses: Not hard at all  Food Insecurity: No Food Insecurity (05/31/2023)   Hunger Vital Sign    Worried About Running Out of Food in the Last Year: Never true    Ran Out of Food in the Last Year: Never true  Transportation Needs: No Transportation Needs (05/31/2023)   PRAPARE - Administrator, Civil Service (Medical): No    Lack of Transportation (Non-Medical): No  Physical Activity: Sufficiently Active (05/31/2023)   Exercise Vital Sign    Days of Exercise per Week: 5 days    Minutes of Exercise per Session: 70 min  Stress: No Stress Concern Present (05/31/2023)   [employer] of Occupational Health - Occupational Stress Questionnaire    Feeling of Stress : Not at all  Social Connections: Moderately Integrated (05/31/2023)   Social Connection and Isolation Panel [NHANES]    Frequency of Communication with Friends and Family: More than three times a week    Frequency of Social Gatherings with Friends and Family: Three times a week    Attends Religious Services: More than 4 times per year    Active Member of Clubs or Organizations: Yes    Attends Banker Meetings: More than 4 times per year    Marital Status: Never married   Vitals:   06/28/23 1056  BP: 128/82  Pulse: (!) 59  Resp: 16  SpO2: 96%   Body mass index is 25.36 kg/m.  Physical Exam Vitals and nursing note reviewed.  Constitutional:      General: She is not in acute distress.    Appearance: She is well-developed.  HENT:     Head: Normocephalic and atraumatic.     Mouth/Throat:     Mouth: Mucous membranes are moist.     Pharynx: Uvula midline.  Eyes:     Conjunctiva/sclera: Conjunctivae normal.  Cardiovascular:     Rate and Rhythm: Regular rhythm. Bradycardia present.     Pulses:           Posterior tibial pulses are 2+ on the right side and 2+ on the left side.     Heart sounds: No murmur heard. Pulmonary:     Effort: Pulmonary effort is normal. No respiratory distress.     Breath sounds: Normal breath sounds.  Abdominal:     Palpations: Abdomen is soft. There is no mass.     Tenderness: There is no abdominal tenderness.  Musculoskeletal:     Right lower leg: No edema.     Left lower leg: No edema.  Lymphadenopathy:     Cervical: No cervical adenopathy.  Skin:    General: Skin is warm.     Findings: No erythema or rash.  Neurological:     General: No focal  deficit present.     Mental Status: She is alert and oriented to person, place, and time.     Cranial Nerves: No cranial nerve deficit.     Gait: Gait normal.  Psychiatric:        Mood and Affect: Mood and affect normal.   ASSESSMENT AND PLAN:  [name] was seen today for chronic disease management.   Essential hypertension, benign Assessment & Plan: BP has improved, a few his BPs 130s-140s. She agrees with continuing decreasing verapamil  dose, she just got a refill for verapamil  180 mg cap, last visit it was decreased to 120 mg tab.  She would like to try splitting capsule content in half instead ordering new dose of verapamil , 90 mg daily. Valsartan  increased from 80 mg to 160 mg, she can take the whole tablet. Continue triamterene -hydrochlorothiazide 75-50 mg 1/2 tablet daily as well as low-salt diet. Continue monitoring BP regularly. She has an appointment with cardiologist on 07/25/2023.  Sinus bradycardia Improved. Most HR at home mid and upper 50's, some low 60's. Will continue weaning of verapamil .  GAD (generalized anxiety disorder) Assessment & Plan: Continue alprazolam  1 mg 1/2 to 1 tablet daily as needed. F/U in 5 months.  Return in about 19 weeks (around 11/08/2023) for chronic problems.  I, Fritz Jewel Wierda, acting as a scribe for Kassim Guertin [place], MD., have documented all  relevant documentation on the behalf of Arelie Kuzel [place], MD, as directed by  Aerabella Galasso [place], MD while in the presence of Garrison Michie [place], MD.   I, Amaree Loisel [place], MD, have reviewed all documentation for this visit. The documentation on 06/28/23 for the exam, diagnosis, procedures, and orders are all accurate and complete.  Tamberlyn Midgley G. [place], MD  [facility]. [place] office.

## 2023-07-04 ENCOUNTER — Encounter: Payer: Self-pay | Admitting: Family Medicine

## 2023-07-05 ENCOUNTER — Ambulatory Visit
Admission: RE | Admit: 2023-07-05 | Discharge: 2023-07-05 | Disposition: A | Discharge status: Home / Self Care | Source: Ambulatory Visit | Attending: Family Medicine | Admitting: Family Medicine

## 2023-07-05 DIAGNOSIS — Z1231 Encounter for screening mammogram for malignant neoplasm of breast: Secondary | ICD-10-CM

## 2023-07-25 ENCOUNTER — Telehealth: Payer: Self-pay | Admitting: Internal Medicine

## 2023-07-25 ENCOUNTER — Encounter: Payer: Self-pay | Admitting: Internal Medicine

## 2023-07-25 ENCOUNTER — Other Ambulatory Visit: Payer: Self-pay | Admitting: Internal Medicine

## 2023-07-25 ENCOUNTER — Ambulatory Visit: Attending: Internal Medicine

## 2023-07-25 ENCOUNTER — Ambulatory Visit: Attending: Internal Medicine | Admitting: Internal Medicine

## 2023-07-25 ENCOUNTER — Ambulatory Visit: Admitting: Internal Medicine

## 2023-07-25 VITALS — BP 128/78 | HR 61 | Ht 66.0 in | Wt 152.6 lb

## 2023-07-25 DIAGNOSIS — I491 Atrial premature depolarization: Secondary | ICD-10-CM | POA: Diagnosis not present

## 2023-07-25 DIAGNOSIS — R0789 Other chest pain: Secondary | ICD-10-CM | POA: Diagnosis not present

## 2023-07-25 DIAGNOSIS — R0609 Other forms of dyspnea: Secondary | ICD-10-CM | POA: Diagnosis not present

## 2023-07-25 NOTE — Telephone Encounter (Signed)
 Checking percert on the following patient   2 week XT Dx: PAC

## 2023-07-25 NOTE — Patient Instructions (Addendum)
 Medication Instructions:  Your physician recommends that you continue on your current medications as directed. Please refer to the Current Medication list given to you today.   Labwork: None  Testing/Procedures: Your physician has requested that you have an echocardiogram. Echocardiography is a painless test that uses sound waves to create images of your heart. It provides your doctor with information about the size and shape of your heart and how well your heart's chambers and valves are working. This procedure takes approximately one hour. There are no restrictions for this procedure. Please do NOT wear cologne, perfume, aftershave, or lotions (deodorant is allowed). Please arrive 15 minutes prior to your appointment time.  Please note: We ask at that you not bring children with you during ultrasound (echo/ vascular) testing. Due to room size and safety concerns, children are not allowed in the ultrasound rooms during exams. Our front office staff cannot provide observation of children in our lobby area while testing is being conducted. An adult accompanying a patient to their appointment will only be allowed in the ultrasound room at the discretion of the ultrasound technician under special circumstances. We apologize for any inconvenience.  Your physician has recommended that you wear a Zio monitor.   This monitor is a medical device that records the heart's electrical activity. Doctors most often use these monitors to diagnose arrhythmias. Arrhythmias are problems with the speed or rhythm of the heartbeat. The monitor is a small device applied to your chest. You can wear one while you do your normal daily activities. While wearing this monitor if you have any symptoms to push the button and record what you felt. Once you have worn this monitor for the period of time provider prescribed (for 14 days), you will return the monitor device in the postage paid box. Once it is returned they will download  the data collected and provide [place] with a report which the provider will then review and we will call you with those results. Important tips:  Avoid showering during the first 24 hours of wearing the monitor. Avoid excessive sweating to help maximize wear time. Do not submerge the device, no hot tubs, and no swimming pools. Keep any lotions or oils away from the patch. After 24 hours you may shower with the patch on. Take brief showers with your back facing the shower head.  Do not remove patch once it has been placed because that will interrupt data and decrease adhesive wear time. Push the button when you have any symptoms and write down what you were feeling. Once you have completed wearing your monitor, remove and place into box which has postage paid and place in your outgoing mailbox.  If for some reason you have misplaced your box then call our office and we can provide another box and/or mail it off for you.   Follow-Up: Your physician recommends that you schedule a follow-up appointment in: Pending Results  Any Other Special Instructions Will Be Listed Below (If Applicable). Thank you for choosing [facility]!      If you need a refill on your cardiac medications before your next appointment, please call your pharmacy.

## 2023-07-25 NOTE — Progress Notes (Signed)
 Cardiology Office Note  Date: 07/25/2023   ID: Elma, Shands Apr 26, 1960, MRN [id number]  PCP:  [place], Betty G, MD  Cardiologist:  Diannah SHAUNNA Maywood, MD Electrophysiologist:  None   History of Present Illness: [name] is a 63 y.o. female  Referred to cardiology clinic for EKG evidence of supraventricular complexes/fusion complexes.  Does not have palpitations.  No angina, dizziness, syncope or leg swelling.  She reports having dyspnea on exertion, especially with overexertion activities.  She climbed a long flight of stairs and stated that she felt more short of breath compared to her peers.  She works out and noticed SOB at the end of her workout.  She also reported having chest pain at the end of her workout and not during the workout.  Past Medical History:  Diagnosis Date   Allergic rhinitis    Anxiety    Asthma    Chronic bronchitis    Constipation    Depression    GERD (gastroesophageal reflux disease)    History of eustachian tube dysfunction 09/14/2017   Hypertension    Migraines    Plantar fasciitis    PONV (postoperative nausea and vomiting)     Past Surgical History:  Procedure Laterality Date   COLONOSCOPY  MAY 2012 ARS   SLF: NSAID COLON ULCERS, SML IH   COLONOSCOPY WITH PROPOFOL  N/A 12/14/2020   Procedure: COLONOSCOPY WITH PROPOFOL ;  Surgeon: Cindie Carlin POUR, DO;  Location: [facility];  Service: Endoscopy;  Laterality: N/A;  7:30am   KNEE ARTHROSCOPY WITH MEDIAL MENISECTOMY Left 10/05/2012   Procedure: LEFT KNEE ARTHROSCOPY WITH MEDIAL MENISECTOMY;  Surgeon: Taft FORBES Minerva, MD;  Location: [facility];  Service: Orthopedics;  Laterality: Left;   MYRINGOTOMY     NASAL SINUS SURGERY     right foot     plantar fascitis   TONSILLECTOMY     UPPER GASTROINTESTINAL ENDOSCOPY  MAY 2012 DYSPHAGIA   Ak:HJDUMPUPD/ILNIZWPUPD, ESO STR-SAV DIL 15 MM   UPPER GASTROINTESTINAL ENDOSCOPY  MAY 2012   SAV DIL , next one w/ propofol      Current Outpatient Medications  Medication Sig Dispense Refill   ALPRAZolam  (XANAX ) 1 MG tablet TAKE (1/2) TO 1 TABLET BY MOUTH AT BEDTIME AS NEEDED FOR ANXIETY. 30 tablet 3   clotrimazole -betamethasone  (LOTRISONE ) cream Apply 1 Application topically daily as needed. 30 g 2   Fluticasone -Umeclidin-Vilant (TRELEGY ELLIPTA ) 100-62.5-25 MCG/ACT AEPB Inhale 1 puff into the lungs daily. (Patient taking differently: Inhale 1 puff into the lungs as needed.) 1 each 3   linaclotide  (LINZESS ) 145 MCG CAPS capsule Take 1 capsule (145 mcg total) by mouth daily before breakfast. 90 capsule 3   triamterene -hydrochlorothiazide (MAXZIDE) 75-50 MG tablet Take 0.5 tablets by mouth daily. (Patient taking differently: Take 0.5 tablets by mouth every morning.) 90 tablet 2   valsartan  (DIOVAN ) 160 MG tablet Take 1 tablet (160 mg total) by mouth daily. (Patient taking differently: Take 160 mg by mouth at bedtime.) 90 tablet 3   verapamil  (CALAN -SR) 120 MG CR tablet Take 1 tablet (120 mg total) by mouth at bedtime. (Patient taking differently: Take 60 mg by mouth at bedtime.) 30 tablet 2   No current facility-administered medications for this visit.   Allergies:  Levaquin [levofloxacin in d5w]   Social History: The patient  reports that she has never smoked. She has never used smokeless tobacco. She reports current alcohol use. She reports that she does not use drugs.   Family History: The patient's  family history includes Heart failure in her father; Hypertension in her mother; Lung cancer in her mother.   ROS:  Please see the history of present illness. Otherwise, complete review of systems is positive for none  All other systems are reviewed and negative.   Physical Exam: VS:  BP 128/78   Pulse 61   Ht 5' 6 (1.676 m)   Wt 152 lb 9.6 oz (69.2 kg)   SpO2 95%   BMI 24.63 kg/m , BMI Body mass index is 24.63 kg/m.  Wt Readings from Last 3 Encounters:  07/25/23 152 lb 9.6 oz (69.2 kg)  06/28/23 157 lb 2  oz (71.3 kg)  05/31/23 156 lb 9.6 oz (71 kg)    General: Patient appears comfortable at rest. HEENT: Conjunctiva and lids normal, oropharynx clear with moist mucosa. Neck: Supple, no elevated JVP or carotid bruits, no thyromegaly. Lungs: Clear to auscultation, nonlabored breathing at rest. Cardiac: Regular rate and rhythm, no S3 or significant systolic murmur, no pericardial rub. Abdomen: Soft, nontender, no hepatomegaly, bowel sounds present, no guarding or rebound. Extremities: No pitting edema, distal pulses 2+. Skin: Warm and dry. Musculoskeletal: No kyphosis. Neuropsychiatric: Alert and oriented x3, affect grossly appropriate.  Recent Labwork: 09/05/2022: ALT 15; AST 24 03/08/2023: TSH 1.76 06/21/2023: BUN 21; Creatinine, Ser 0.83; Potassium 3.8; Sodium 134     Component Value Date/Time   CHOL 221 (H) 03/08/2023 0908   CHOL 220 (H) 06/07/2021 0950   TRIG 65.0 03/08/2023 0908   HDL 96.00 03/08/2023 0908   HDL 110 06/07/2021 0950   CHOLHDL 2 03/08/2023 0908   VLDL 13.0 03/08/2023 0908   LDLCALC 112 (H) 03/08/2023 0908   LDLCALC 98 06/07/2021 0950    Assessment and Plan:  Supraventricular complexes with aberrancy: EKG from May 2025 showed supraventricular complexes with aberrancy/fusion complexes.  Asymptomatic.  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  Will obtain 2-week event monitor, NL to rule out any major arrhythmias.  EKG today showed NSR, no PAC or PVC.  Chest pain, noncardiac/musculoskeletal: Chest pain occurred only after workout session and not during the workout.  No chest pain with regular exertional activities.  Noncardiac, no further cardiac workup at this time.  DOE: DOE only with overexertion and sometimes with regular activities compared to her peers.  Obtain echocardiogram.  But she works out does not have dyspnea on exertion until the workout is intense.        Medication Adjustments/Labs and Tests Ordered: Current  medicines are reviewed at length with the patient today.  Concerns regarding medicines are outlined above.    Disposition:  Follow up pending results  Signed Maely Clements Priya Ashling Roane, MD, 07/25/2023 12:25 PM    [facility] at [facility] [address] [place], [place], [place] [address]

## 2023-08-11 DIAGNOSIS — I491 Atrial premature depolarization: Secondary | ICD-10-CM | POA: Diagnosis not present

## 2023-08-16 ENCOUNTER — Ambulatory Visit: Payer: Self-pay | Admitting: Internal Medicine

## 2023-08-16 ENCOUNTER — Ambulatory Visit: Attending: Internal Medicine

## 2023-08-16 DIAGNOSIS — R0609 Other forms of dyspnea: Secondary | ICD-10-CM

## 2023-08-16 DIAGNOSIS — I088 Other rheumatic multiple valve diseases: Secondary | ICD-10-CM

## 2023-08-16 LAB — ECHOCARDIOGRAM COMPLETE
AR max vel: 2.26 cm2
AV Area VTI: 2.05 cm2
AV Area mean vel: 2 cm2
AV Mean grad: 8 mmHg
AV Peak grad: 16 mmHg
Ao pk vel: 2 m/s
Area-P 1/2: 2.8 cm2
Calc EF: 69.3 %
MV VTI: 2.52 cm2
S' Lateral: 2.9 cm
Single Plane A2C EF: 69.8 %
Single Plane A4C EF: 68.8 %

## 2023-08-21 ENCOUNTER — Encounter: Payer: Self-pay | Admitting: Family Medicine

## 2023-08-21 DIAGNOSIS — I10 Essential (primary) hypertension: Secondary | ICD-10-CM

## 2023-08-21 MED ORDER — TRIAMTERENE-HCTZ 75-50 MG PO TABS: 0.5000 | ORAL_TABLET | Freq: Every day | ORAL | 2 refills | Status: DC

## 2023-08-31 ENCOUNTER — Ambulatory Visit: Payer: Self-pay | Admitting: Internal Medicine

## 2023-09-01 ENCOUNTER — Encounter: Payer: Self-pay | Admitting: Internal Medicine

## 2023-09-06 ENCOUNTER — Encounter: Payer: 59 | Admitting: Family Medicine

## 2023-10-29 ENCOUNTER — Encounter: Payer: Self-pay | Admitting: Family Medicine

## 2023-10-30 ENCOUNTER — Other Ambulatory Visit: Payer: Self-pay

## 2023-10-30 DIAGNOSIS — J4520 Mild intermittent asthma, uncomplicated: Secondary | ICD-10-CM

## 2023-10-30 DIAGNOSIS — R058 Other specified cough: Secondary | ICD-10-CM

## 2023-10-30 DIAGNOSIS — J45901 Unspecified asthma with (acute) exacerbation: Secondary | ICD-10-CM

## 2023-10-30 MED ORDER — TRELEGY ELLIPTA 100-62.5-25 MCG/ACT IN AEPB: 1.0000 | INHALATION_SPRAY | Freq: Every day | RESPIRATORY_TRACT | 3 refills | Status: AC

## 2023-11-06 ENCOUNTER — Ambulatory Visit: Admitting: Family Medicine

## 2023-11-06 ENCOUNTER — Encounter: Payer: Self-pay | Admitting: Family Medicine

## 2023-11-06 VITALS — BP 130/80 | HR 55 | Temp 97.9°F | Resp 16 | Ht 66.0 in | Wt 160.4 lb

## 2023-11-06 DIAGNOSIS — E559 Vitamin D deficiency, unspecified: Secondary | ICD-10-CM

## 2023-11-06 DIAGNOSIS — Z13228 Encounter for screening for other metabolic disorders: Secondary | ICD-10-CM | POA: Diagnosis not present

## 2023-11-06 DIAGNOSIS — Z130 Encounter for screening for diseases of the blood and blood-forming organs and certain disorders involving the immune mechanism: Secondary | ICD-10-CM

## 2023-11-06 DIAGNOSIS — E782 Mixed hyperlipidemia: Secondary | ICD-10-CM | POA: Diagnosis not present

## 2023-11-06 DIAGNOSIS — I10 Essential (primary) hypertension: Secondary | ICD-10-CM | POA: Diagnosis not present

## 2023-11-06 DIAGNOSIS — Z0000 Encounter for general adult medical examination without abnormal findings: Secondary | ICD-10-CM | POA: Diagnosis not present

## 2023-11-06 DIAGNOSIS — F411 Generalized anxiety disorder: Secondary | ICD-10-CM

## 2023-11-06 DIAGNOSIS — Z1329 Encounter for screening for other suspected endocrine disorder: Secondary | ICD-10-CM

## 2023-11-06 LAB — LIPID PANEL
Cholesterol: 195 mg/dL (ref 0–200)
HDL: 99.4 mg/dL (ref >39.00)
LDL Cholesterol: 82 mg/dL (ref 0–99)
NonHDL: 95.12
Total CHOL/HDL Ratio: 2
Triglycerides: 64 mg/dL (ref 0.0–149.0)
VLDL: 12.8 mg/dL (ref 0.0–40.0)

## 2023-11-06 LAB — VITAMIN D 25 HYDROXY (VIT D DEFICIENCY, FRACTURES): VITD: 34 ng/mL (ref 30.00–100.00)

## 2023-11-06 LAB — COMPREHENSIVE METABOLIC PANEL WITH GFR
ALT: 14 U/L (ref 0–35)
AST: 19 U/L (ref 0–37)
Albumin: 4.1 g/dL (ref 3.5–5.2)
Alkaline Phosphatase: 91 U/L (ref 39–117)
BUN: 10 mg/dL (ref 6–23)
CO2: 25 meq/L (ref 19–32)
Calcium: 9.8 mg/dL (ref 8.4–10.5)
Chloride: 94 meq/L — ABNORMAL LOW (ref 96–112)
Creatinine, Ser: 0.73 mg/dL (ref 0.40–1.20)
GFR: 86.71 mL/min (ref >60.00)
Glucose, Bld: 93 mg/dL (ref 70–99)
Potassium: 4 meq/L (ref 3.5–5.1)
Sodium: 131 meq/L — ABNORMAL LOW (ref 135–145)
Total Bilirubin: 0.5 mg/dL (ref 0.2–1.2)
Total Protein: 7.4 g/dL (ref 6.0–8.3)

## 2023-11-06 LAB — HEMOGLOBIN A1C: Hgb A1c MFr Bld: 5.8 % (ref 4.6–6.5)

## 2023-11-06 MED ORDER — AMLODIPINE BESYLATE 5 MG PO TABS: 5.0000 mg | ORAL_TABLET | Freq: Every day | ORAL | 0 refills | Status: DC

## 2023-11-06 MED ORDER — TRIAMTERENE-HCTZ 75-50 MG PO TABS: 0.5000 | ORAL_TABLET | Freq: Every day | ORAL | 2 refills | Status: AC

## 2023-11-06 NOTE — Assessment & Plan Note (Signed)
 Verapamil  d/c. Amlodpine added.

## 2023-11-06 NOTE — Patient Instructions (Addendum)
 A few things to remember from today's visit:  Routine general medical examination at a health care facility  Essential hypertension, benign - Plan: triamterene -hydrochlorothiazide (MAXZIDE) 75-50 MG tablet, amLODipine (NORVASC) 5 MG tablet, Comprehensive metabolic panel with GFR  Screening for endocrine, metabolic and immunity disorder - Plan: Comprehensive metabolic panel with GFR, Hemoglobin A1c  Vitamin D  insufficiency - Plan: VITAMIN D  25 Hydroxy (Vit-D Deficiency, Fractures)  Mixed hyperlipidemia - Plan: Lipid panel Take 1/2 verapamil  tonight and Wednesday then start Amlodipine 5 mg daily. Blood pressure readings in 3-4 weeks. Use inhaler daily or at least every other day. Plain Mucinex may help.  If you need refills for medications you take chronically, please call your pharmacy. Do not use My Chart to request refills or for acute issues that need immediate attention. If you send a my chart message, it may take a few days to be addressed, specially if I am not in the office.  Please be sure medication list is accurate. If a new problem present, please set up appointment sooner than planned today.  Health Maintenance, Female Adopting a healthy lifestyle and getting preventive care are important in promoting health and wellness. Ask your health care provider about: The right schedule for you to have regular tests and exams. Things you can do on your own to prevent diseases and keep yourself healthy. What should I know about diet, weight, and exercise? Eat a healthy diet  Eat a diet that includes plenty of vegetables, fruits, low-fat dairy products, and lean protein. Do not eat a lot of foods that are high in solid fats, added sugars, or sodium. Maintain a healthy weight Body mass index (BMI) is used to identify weight problems. It estimates body fat based on height and weight. Your health care provider can help determine your BMI and help you achieve or maintain a healthy  weight. Get regular exercise Get regular exercise. This is one of the most important things you can do for your health. Most adults should: Exercise for at least 150 minutes each week. The exercise should increase your heart rate and make you sweat (moderate-intensity exercise). Do strengthening exercises at least twice a week. This is in addition to the moderate-intensity exercise. Spend less time sitting. Even light physical activity can be beneficial. Watch cholesterol and blood lipids Have your blood tested for lipids and cholesterol at 64 years of age, then have this test every 5 years. Have your cholesterol levels checked more often if: Your lipid or cholesterol levels are high. You are older than 64 years of age. You are at high risk for heart disease. What should I know about cancer screening? Depending on your health history and family history, you may need to have cancer screening at various ages. This may include screening for: Breast cancer. Cervical cancer. Colorectal cancer. Skin cancer. Lung cancer. What should I know about heart disease, diabetes, and high blood pressure? Blood pressure and heart disease High blood pressure causes heart disease and increases the risk of stroke. This is more likely to develop in people who have high blood pressure readings or are overweight. Have your blood pressure checked: Every 3-5 years if you are 62-77 years of age. Every year if you are 77 years old or older. Diabetes Have regular diabetes screenings. This checks your fasting blood sugar level. Have the screening done: Once every three years after age 45 if you are at a normal weight and have a low risk for diabetes. More often and at a  younger age if you are overweight or have a high risk for diabetes. What should I know about preventing infection? Hepatitis B If you have a higher risk for hepatitis B, you should be screened for this virus. Talk with your health care provider to  find out if you are at risk for hepatitis B infection. Hepatitis C Testing is recommended for: Everyone born from [birth year] through 1966. Anyone with known risk factors for hepatitis C. Sexually transmitted infections (STIs) Get screened for STIs, including gonorrhea and chlamydia, if: You are sexually active and are younger than 64 years of age. You are older than 64 years of age and your health care provider tells you that you are at risk for this type of infection. Your sexual activity has changed since you were last screened, and you are at increased risk for chlamydia or gonorrhea. Ask your health care provider if you are at risk. Ask your health care provider about whether you are at high risk for HIV. Your health care provider may recommend a prescription medicine to help prevent HIV infection. If you choose to take medicine to prevent HIV, you should first get tested for HIV. You should then be tested every 3 months for as long as you are taking the medicine. Pregnancy If you are about to stop having your period (premenopausal) and you may become pregnant, seek counseling before you get pregnant. Take 400 to 800 micrograms (mcg) of folic acid every day if you become pregnant. Ask for birth control (contraception) if you want to prevent pregnancy. Osteoporosis and menopause Osteoporosis is a disease in which the bones lose minerals and strength with aging. This can result in bone fractures. If you are 96 years old or older, or if you are at risk for osteoporosis and fractures, ask your health care provider if you should: Be screened for bone loss. Take a calcium or vitamin D  supplement to lower your risk of fractures. Be given hormone replacement therapy (HRT) to treat symptoms of menopause. Follow these instructions at home: Alcohol use Do not drink alcohol if: Your health care provider tells you not to drink. You are pregnant, may be pregnant, or are planning to become pregnant. If you  drink alcohol: Limit how much you have to: 0-1 drink a day. Know how much alcohol is in your drink. In the [place] one drink equals one 12 oz bottle of beer (355 mL), one 5 oz glass of wine (148 mL), or one 1 oz glass of hard liquor (44 mL). Lifestyle Do not use any products that contain nicotine or tobacco. These products include cigarettes, chewing tobacco, and vaping devices, such as e-cigarettes. If you need help quitting, ask your health care provider. Do not use street drugs. Do not share needles. Ask your health care provider for help if you need support or information about quitting drugs. General instructions Schedule regular health, dental, and eye exams. Stay current with your vaccines. Tell your health care provider if: You often feel depressed. You have ever been abused or do not feel safe at home. Summary Adopting a healthy lifestyle and getting preventive care are important in promoting health and wellness. Follow your health care provider's instructions about healthy diet, exercising, and getting tested or screened for diseases. Follow your health care provider's instructions on monitoring your cholesterol and blood pressure. This information is not intended to replace advice given to you by your health care provider. Make sure you discuss any questions you have with your health care  provider. Document Revised: 06/08/2020 Document Reviewed: 06/08/2020 Elsevier Patient Education  2024 [employer].

## 2023-11-06 NOTE — Assessment & Plan Note (Signed)
 We discussed the importance of regular physical activity and healthy diet for prevention of chronic illness and/or complications. Preventive guidelines reviewed. Vaccination: She does not take flu shots.  We can wait for Prevnar 20 when she turns 64, 02/2024. She has not interested in having Pap smear. Next CPE in a year.

## 2023-11-06 NOTE — Assessment & Plan Note (Signed)
 Continue current dose of vitamin D supplementation. Further recommendation will be given according to 25 OH vitamin D result.

## 2023-11-06 NOTE — Progress Notes (Unsigned)
 Chief Complaint  Patient presents with   Annual Exam    HPI: [name] is a 63 y.o. female with PMHx significant for HTN, asthma, GERD, sensorineural hearing loss, vitamin D  deficiency, HLD, insomnia, and anxiety , who is here today for her routine physical.  Last CPE: Over a year ago.  She exercises regularly including weight lifting, walking and biking about 4 times per week. She eats mainly at home, cooks her meals, rarely eats out. She tries to eat vegetables daily. She sleeps an average of 7.5 hours per night. No hx of tobacco use and consumes alcohol occasionally, a couple of drinks per month. She sees her dentist regularly and had her last eye examination over a year ago.  Immunization History  Administered Date(s) Administered   Moderna SARS-COV2 Booster Vaccination 06/02/2020   Moderna Sars-Covid-2 Vaccination 02/04/2019, 03/07/2019, 12/17/2019   Td 12/10/2015   Zoster Recombinant(Shingrix ) 09/06/2021, 03/07/2022   Health Maintenance  Topic Date Due   Pneumococcal Vaccine: 50+ Years (1 of 2 - PCV) Never done   Cervical Cancer Screening (HPV/Pap Cotest)  03/04/2021   Influenza Vaccine  Never done   COVID-19 Vaccine (5 - 2025-26 season) 10/02/2023   Mammogram  07/04/2025   DTaP/Tdap/Td (2 - Tdap) 12/09/2025   Colonoscopy  12/15/2030   Hepatitis C Screening  Completed   HIV Screening  Completed   Zoster Vaccines- Shingrix   Completed   Hepatitis B Vaccines 19-59 Average Risk  Aged Out   HPV VACCINES  Aged Out   Meningococcal B Vaccine  Aged Out   She is overdue for pap smear, she is not interested in having it.  Asthma and chronic cough: Productive cough, especially in the morning, it seems to be getting worse. Currently she is on Trelegy Ellipta  100-62.5-25 mcg 1 puff , which she uses as needed, about once per week. Negative for fever or chills. Evaluated by pulmonology in the past.  HTN: She is currently taking Triamterene -Hydrochlorothiazide  37.5-25 mg daily and Verapamil  120 mg one half daily. Most BP readings at home most 120-130/60-70's, occasionally SBP's 140's.  Lab Results  Component Value Date   NA 134 (L) 06/21/2023   CL 98 06/21/2023   K 3.8 06/21/2023   CO2 29 06/21/2023   BUN 21 06/21/2023   CREATININE 0.83 06/21/2023   GFR 74.52 06/21/2023   CALCIUM 9.7 06/21/2023   ALBUMIN 4.2 09/05/2022   GLUCOSE 101 (H) 06/21/2023   Vit D def: She is on Vit D 5000 U q 3 days. Lab Results  Component Value Date   VD25OH 50.39 03/07/2022   HLD on non pharmacologic treatment. Lab Results  Component Value Date   CHOL 221 (H) 03/08/2023   HDL 96.00 03/08/2023   LDLCALC 112 (H) 03/08/2023   TRIG 65.0 03/08/2023   CHOLHDL 2 03/08/2023   Anxiety and insomnia: She takes Alprazolam  1 mg at night prn, not frequently. Tolerating medication well.  Review of Systems  Constitutional:  Negative for activity change, appetite change, chills and fever.  HENT:  Negative for mouth sores, sore throat and trouble swallowing.   Eyes:  Negative for redness and visual disturbance.  Respiratory:  Positive for cough and wheezing. Negative for shortness of breath.   Cardiovascular:  Negative for chest pain, palpitations and leg swelling.  Gastrointestinal:  Negative for abdominal pain, nausea and vomiting.  Endocrine: Negative for cold intolerance, heat intolerance, polydipsia, polyphagia and polyuria.  Genitourinary:  Negative for decreased urine volume, dysuria, hematuria, vaginal bleeding and  vaginal discharge.  Musculoskeletal:  Negative for gait problem and myalgias.  Skin:  Negative for color change and rash.  Allergic/Immunologic: Positive for environmental allergies.  Neurological:  Negative for syncope, weakness and headaches.  Hematological:  Negative for adenopathy. Does not bruise/bleed easily.  Psychiatric/Behavioral:  Negative for confusion and hallucinations.   All other systems reviewed and are negative.  Current  Outpatient Medications on File Prior to Visit  Medication Sig Dispense Refill   ALPRAZolam  (XANAX ) 1 MG tablet TAKE (1/2) TO 1 TABLET BY MOUTH AT BEDTIME AS NEEDED FOR ANXIETY. 30 tablet 3   clotrimazole -betamethasone  (LOTRISONE ) cream Apply 1 Application topically daily as needed. 30 g 2   Fluticasone -Umeclidin-Vilant (TRELEGY ELLIPTA ) 100-62.5-25 MCG/ACT AEPB Inhale 1 puff into the lungs daily. 1 each 3   linaclotide  (LINZESS ) 145 MCG CAPS capsule Take 1 capsule (145 mcg total) by mouth daily before breakfast. 90 capsule 3   triamterene -hydrochlorothiazide (MAXZIDE) 75-50 MG tablet Take 0.5 tablets by mouth daily. 45 tablet 2   valsartan  (DIOVAN ) 160 MG tablet Take 1 tablet (160 mg total) by mouth daily. (Patient taking differently: Take 160 mg by mouth at bedtime.) 90 tablet 3   verapamil  (CALAN -SR) 120 MG CR tablet Take 1 tablet (120 mg total) by mouth at bedtime. (Patient taking differently: Take 60 mg by mouth at bedtime.) 30 tablet 2   No current facility-administered medications on file prior to visit.   Past Medical History:  Diagnosis Date   Allergic rhinitis    Anxiety    Asthma    Chronic bronchitis    Constipation    Depression    GERD (gastroesophageal reflux disease)    History of eustachian tube dysfunction 09/14/2017   Hypertension    Migraines    Plantar fasciitis    PONV (postoperative nausea and vomiting)     Past Surgical History:  Procedure Laterality Date   COLONOSCOPY  MAY 2012 ARS   SLF: NSAID COLON ULCERS, SML IH   COLONOSCOPY WITH PROPOFOL  N/A 12/14/2020   Procedure: COLONOSCOPY WITH PROPOFOL ;  Surgeon: Cindie Carlin POUR, DO;  Location: [facility];  Service: Endoscopy;  Laterality: N/A;  7:30am   KNEE ARTHROSCOPY WITH MEDIAL MENISECTOMY Left 10/05/2012   Procedure: LEFT KNEE ARTHROSCOPY WITH MEDIAL MENISECTOMY;  Surgeon: Taft FORBES Minerva, MD;  Location: [facility];  Service: Orthopedics;  Laterality: Left;   MYRINGOTOMY     NASAL SINUS SURGERY     right  foot     plantar fascitis   TONSILLECTOMY     UPPER GASTROINTESTINAL ENDOSCOPY  MAY 2012 DYSPHAGIA   Ak:HJDUMPUPD/ILNIZWPUPD, ESO STR-SAV DIL 15 MM   UPPER GASTROINTESTINAL ENDOSCOPY  MAY 2012   SAV DIL , next one w/ propofol     Allergies  Allergen Reactions   Levaquin [Levofloxacin In D5w] Nausea Only    Family History  Problem Relation Age of Onset   Lung cancer Mother        smoker   Hypertension Mother    Heart failure Father    Colon polyps Neg Hx    Crohn's disease Neg Hx    Breast cancer Neg Hx     Social History   Socioeconomic History   Marital status: Single    Spouse name: Not on file   Number of children: 0   Years of education: Not on file   Highest education level: Some college, no degree  Occupational History   Occupation: Surveyor, minerals    Comment: Engineer, maintenance (IT)  Tobacco Use  Smoking status: Never   Smokeless tobacco: Never  Vaping Use   Vaping status: Never Used  Substance and Sexual Activity   Alcohol use: Yes    Comment: Wine at night some times to help her gut.   Drug use: No   Sexual activity: Never    Birth control/protection: Other-see comments  Other Topics Concern   Not on file  Social History Narrative   Single,lives alone.Part time transportation at [facility] in [place].   Social Drivers of Corporate investment banker Strain: Low Risk  (11/05/2023)   Overall Financial Resource Strain (CARDIA)    Difficulty of Paying Living Expenses: Not very hard  Food Insecurity: No Food Insecurity (11/05/2023)   Hunger Vital Sign    Worried About Running Out of Food in the Last Year: Never true    Ran Out of Food in the Last Year: Never true  Transportation Needs: No Transportation Needs (11/05/2023)   PRAPARE - Administrator, Civil Service (Medical): No    Lack of Transportation (Non-Medical): No  Physical Activity: Sufficiently Active (11/05/2023)   Exercise Vital Sign    Days of Exercise  per Week: 4 days    Minutes of Exercise per Session: 60 min  Stress: No Stress Concern Present (11/05/2023)   [employer] of Occupational Health - Occupational Stress Questionnaire    Feeling of Stress: Not at all  Social Connections: Moderately Integrated (11/05/2023)   Social Connection and Isolation Panel    Frequency of Communication with Friends and Family: More than three times a week    Frequency of Social Gatherings with Friends and Family: Three times a week    Attends Religious Services: More than 4 times per year    Active Member of Clubs or Organizations: Yes    Attends Banker Meetings: More than 4 times per year    Marital Status: Never married   Vitals:   11/06/23 0813  BP: 130/80  Pulse: (!) 55  Resp: 16  Temp: 97.9 F (36.6 C)  SpO2: 99%   Body mass index is 25.89 kg/m.  Wt Readings from Last 3 Encounters:  11/06/23 160 lb 6.4 oz (72.8 kg)  07/25/23 152 lb 9.6 oz (69.2 kg)  06/28/23 157 lb 2 oz (71.3 kg)   Physical Exam Vitals and nursing note reviewed.  Constitutional:      General: She is not in acute distress.    Appearance: She is well-developed.  HENT:     Head: Normocephalic and atraumatic.     Right Ear: External ear normal. Tympanic membrane is not erythematous.     Left Ear: External ear normal. Tympanic membrane is not erythematous.     Ears:     Comments: Hearing aids bilateral.    Mouth/Throat:     Mouth: Mucous membranes are moist.     Pharynx: Oropharynx is clear. Uvula midline.  Eyes:     Extraocular Movements: Extraocular movements intact.     Conjunctiva/sclera: Conjunctivae normal.     Pupils: Pupils are equal, round, and reactive to light.  Neck:     Thyroid : No thyroid  mass or thyromegaly.  Cardiovascular:     Rate and Rhythm: Regular rhythm. Bradycardia present.     Pulses:          Dorsalis pedis pulses are 2+ on the right side and 2+ on the left side.     Heart sounds: No murmur heard. Pulmonary:  Effort: Pulmonary effort is normal. No respiratory distress.     Breath sounds: Wheezing present. No rhonchi or rales.  Abdominal:     Palpations: Abdomen is soft. There is no hepatomegaly or mass.     Tenderness: There is no abdominal tenderness.  Genitourinary:    Comments: No concerns, declined pap smear. Musculoskeletal:     Comments: No signs of synovitis appreciated.  Lymphadenopathy:     Cervical: No cervical adenopathy.     Upper Body:     Right upper body: No supraclavicular adenopathy.     Left upper body: No supraclavicular adenopathy.  Skin:    General: Skin is warm.     Findings: No erythema or rash.  Neurological:     General: No focal deficit present.     Mental Status: She is alert and oriented to person, place, and time.     Cranial Nerves: No cranial nerve deficit.     Coordination: Coordination normal.     Gait: Gait normal.     Deep Tendon Reflexes:     Reflex Scores:      Bicep reflexes are 2+ on the right side and 2+ on the left side.      Patellar reflexes are 2+ on the right side and 2+ on the left side. Psychiatric:        Mood and Affect: Mood and affect normal.   ASSESSMENT AND PLAN:  Ms. [name] was here today annual physical examination.  Lab Results  Component Value Date   NA 131 (L) 11/06/2023   CL 94 (L) 11/06/2023   K 4.0 11/06/2023   CO2 25 11/06/2023   BUN 10 11/06/2023   CREATININE 0.73 11/06/2023   GFR 86.71 11/06/2023   CALCIUM 9.8 11/06/2023   ALBUMIN 4.1 11/06/2023   GLUCOSE 93 11/06/2023   Lab Results  Component Value Date   ALT 14 11/06/2023   AST 19 11/06/2023   ALKPHOS 91 11/06/2023   BILITOT 0.5 11/06/2023   Lab Results  Component Value Date   HGBA1C 5.8 11/06/2023   Lab Results  Component Value Date   CHOL 195 11/06/2023   HDL 99.40 11/06/2023   LDLCALC 82 11/06/2023   TRIG 64.0 11/06/2023   CHOLHDL 2 11/06/2023   Lab Results  Component Value Date   VD25OH 34.00 11/06/2023   Routine  general medical examination at a health care facility Assessment & Plan: We discussed the importance of regular physical activity and healthy diet for prevention of chronic illness and/or complications. Preventive guidelines reviewed. Vaccination: She does not take flu shots.  We can wait for Prevnar 20 when she turns 64, 02/2024. She has not interested in having Pap smear. Next CPE in a year.   Essential hypertension, benign Assessment & Plan: Verapamil  d/c. Amlodpine added.  Orders: -     Triamterene -HCTZ; Take 0.5 tablets by mouth daily.  Dispense: 45 tablet; Refill: 2 -     amLODIPine Besylate; Take 1 tablet (5 mg total) by mouth daily.  Dispense: 90 tablet; Refill: 0 -     Comprehensive metabolic panel with GFR; Future  Screening for endocrine, metabolic and immunity disorder -     Comprehensive metabolic panel with GFR; Future -     Hemoglobin A1c; Future  Vitamin D  insufficiency Assessment & Plan: Continue current dose of vitamin D  supplementation. Further recommendation will be given according to 25 OH vitamin D  result.  Orders: -     VITAMIN D  25 Hydroxy (Vit-D Deficiency,  Fractures); Future  Mixed hyperlipidemia Assessment & Plan: Continue nonpharmacologic treatment. Further recommendation will be given according toFLP results.  Orders: -     Lipid panel; Future  GAD (generalized anxiety disorder) Assessment & Plan: Stable. Continue alprazolam  1 mg 1/2 to 1 tablet daily as needed. F/U in 5 months.  Orders: -     ALPRAZolam ; TAKE (1/2) TO 1 TABLET BY MOUTH AT BEDTIME AS NEEDED FOR ANXIETY.  Dispense: 30 tablet; Refill: 3   Return in 6 months (on 05/06/2024).  [name] G. [place], MD  [facility]. [place] office.

## 2023-11-07 ENCOUNTER — Ambulatory Visit: Payer: Self-pay | Admitting: Family Medicine

## 2023-11-07 ENCOUNTER — Encounter: Payer: Self-pay | Admitting: Family Medicine

## 2023-11-07 MED ORDER — ALPRAZOLAM 1 MG PO TABS: ORAL_TABLET | ORAL | 3 refills | Status: AC

## 2023-11-07 NOTE — Assessment & Plan Note (Signed)
 Continue nonpharmacologic treatment. Further recommendation will be given according toFLP results.

## 2023-11-07 NOTE — Assessment & Plan Note (Signed)
 Stable. Continue alprazolam  1 mg 1/2 to 1 tablet daily as needed. F/U in 5 months.

## 2024-02-03 ENCOUNTER — Encounter: Payer: Self-pay | Admitting: Family Medicine

## 2024-02-05 ENCOUNTER — Other Ambulatory Visit: Payer: Self-pay

## 2024-02-05 DIAGNOSIS — I10 Essential (primary) hypertension: Secondary | ICD-10-CM

## 2024-02-05 MED ORDER — AMLODIPINE BESYLATE 5 MG PO TABS: 5.0000 mg | ORAL_TABLET | Freq: Every day | ORAL | 0 refills | Status: AC

## 2024-03-07 ENCOUNTER — Other Ambulatory Visit: Payer: Self-pay | Admitting: Family Medicine

## 2024-03-07 DIAGNOSIS — F411 Generalized anxiety disorder: Secondary | ICD-10-CM

## 2024-05-06 ENCOUNTER — Ambulatory Visit: Admitting: Family Medicine
